# Patient Record
Sex: Male | Born: 1975
Health system: Southern US, Community
[De-identification: ages and names within clinical notes are randomized; demographics above are authoritative.]

## PROBLEM LIST (undated history)

## (undated) DIAGNOSIS — K219 Gastro-esophageal reflux disease without esophagitis: Secondary | ICD-10-CM

## (undated) DIAGNOSIS — I10 Essential (primary) hypertension: Secondary | ICD-10-CM

## (undated) HISTORY — PX: CHOLECYSTECTOMY: SHX55

---

## 2004-03-19 ENCOUNTER — Emergency Department (HOSPITAL_COMMUNITY): Admission: EM | Admit: 2004-03-19 | Discharge: 2004-03-19 | Payer: Self-pay | Admitting: *Deleted

## 2005-01-24 ENCOUNTER — Emergency Department (HOSPITAL_COMMUNITY): Admission: EM | Admit: 2005-01-24 | Discharge: 2005-01-24 | Payer: Self-pay | Admitting: Emergency Medicine

## 2005-01-28 ENCOUNTER — Ambulatory Visit (HOSPITAL_COMMUNITY): Admission: RE | Admit: 2005-01-28 | Discharge: 2005-01-28 | Payer: Self-pay | Admitting: Family Medicine

## 2005-03-07 ENCOUNTER — Encounter (HOSPITAL_COMMUNITY): Admission: RE | Admit: 2005-03-07 | Discharge: 2005-03-07 | Payer: Self-pay | Admitting: Family Medicine

## 2005-03-11 ENCOUNTER — Observation Stay (HOSPITAL_COMMUNITY): Admission: RE | Admit: 2005-03-11 | Discharge: 2005-03-12 | Payer: Self-pay | Admitting: General Surgery

## 2005-03-30 ENCOUNTER — Emergency Department (HOSPITAL_COMMUNITY): Admission: EM | Admit: 2005-03-30 | Discharge: 2005-03-30 | Payer: Self-pay | Admitting: Emergency Medicine

## 2005-04-20 ENCOUNTER — Ambulatory Visit: Payer: Self-pay | Admitting: Internal Medicine

## 2005-05-12 ENCOUNTER — Ambulatory Visit (HOSPITAL_COMMUNITY): Admission: RE | Admit: 2005-05-12 | Discharge: 2005-05-12 | Payer: Self-pay | Admitting: Internal Medicine

## 2005-05-12 ENCOUNTER — Ambulatory Visit: Payer: Self-pay | Admitting: Internal Medicine

## 2005-07-18 ENCOUNTER — Ambulatory Visit: Payer: Self-pay | Admitting: Internal Medicine

## 2005-08-18 ENCOUNTER — Emergency Department (HOSPITAL_COMMUNITY): Admission: EM | Admit: 2005-08-18 | Discharge: 2005-08-18 | Payer: Self-pay | Admitting: Emergency Medicine

## 2005-08-18 ENCOUNTER — Encounter (HOSPITAL_COMMUNITY): Admission: RE | Admit: 2005-08-18 | Discharge: 2005-09-17 | Payer: Self-pay | Admitting: Internal Medicine

## 2005-10-06 ENCOUNTER — Ambulatory Visit: Payer: Self-pay | Admitting: Internal Medicine

## 2006-12-24 IMAGING — CR DG CHEST 2V
2 series · 2 of 2 positions shown · non-contrast
Comparison: Portable film earlier today.

CLINICAL DATA: Chest pain.  Question left lower lobe density on portable chest x-ray.
 CHEST - 2 VIEW:

[view not recorded (1 of 2)]
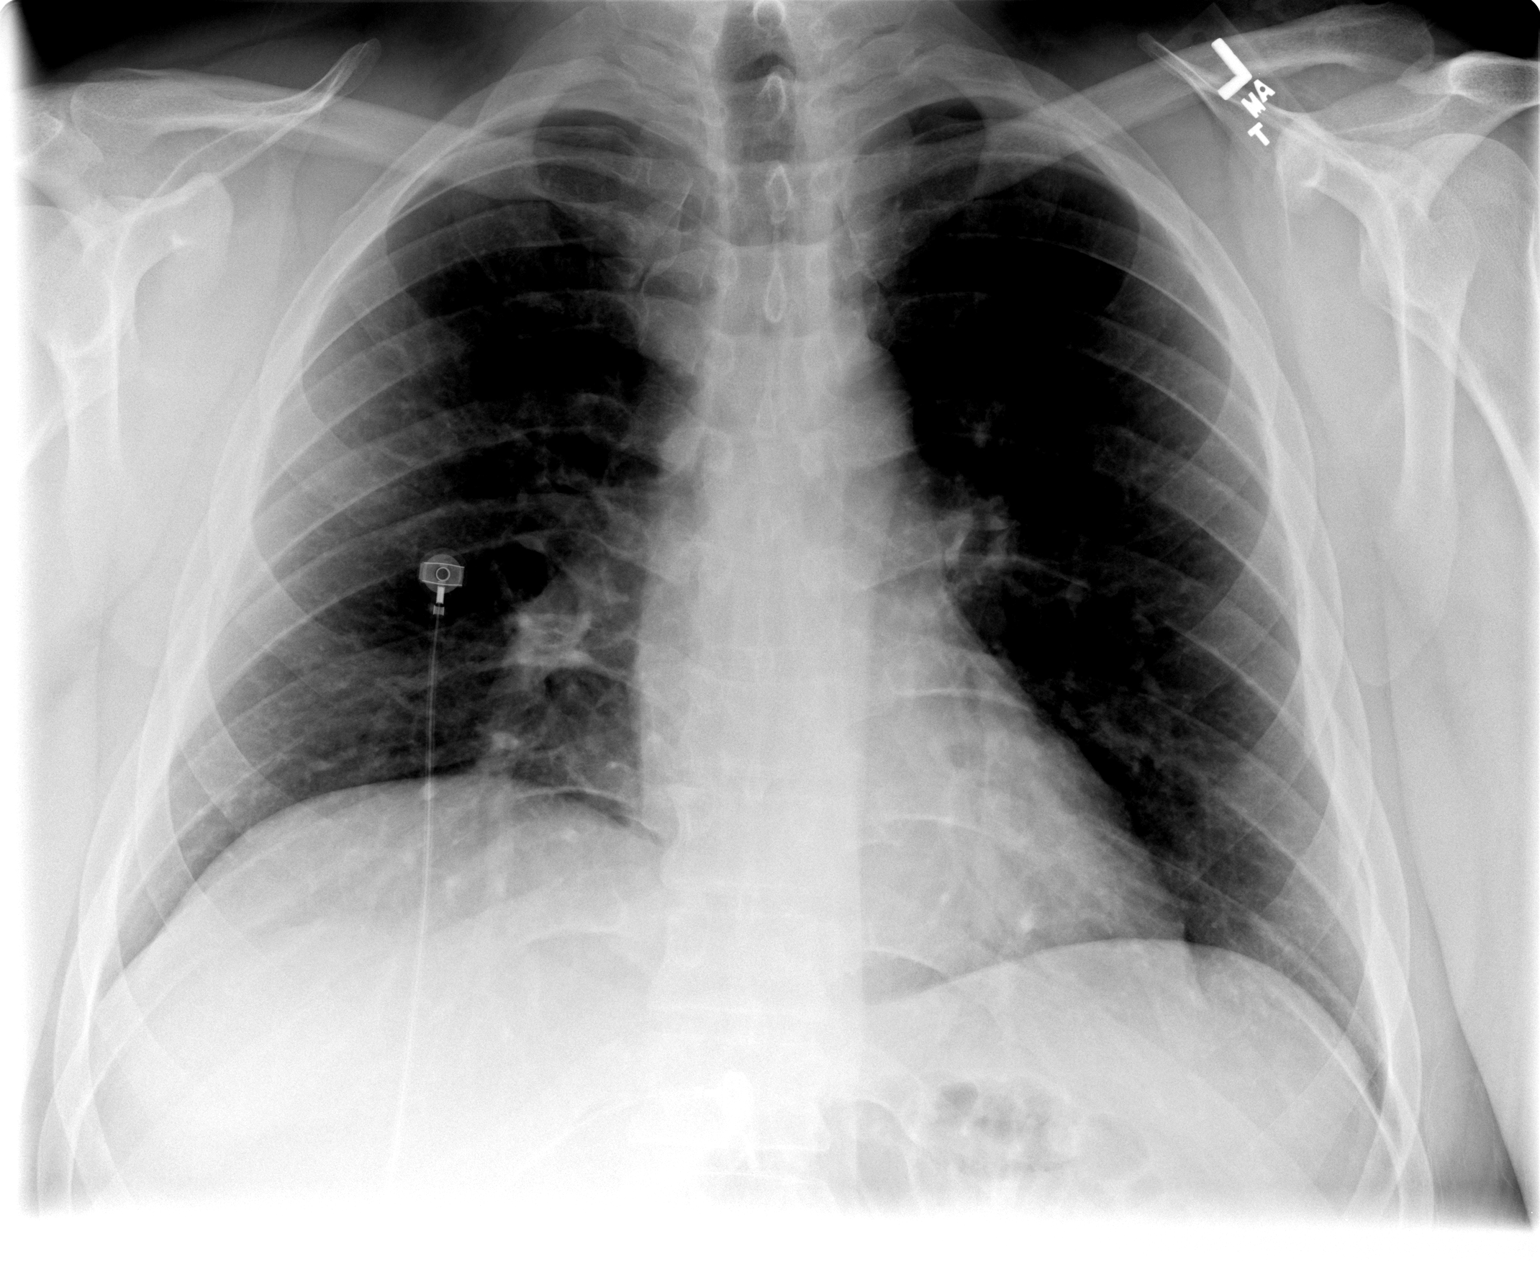

[view not recorded (2 of 2)]
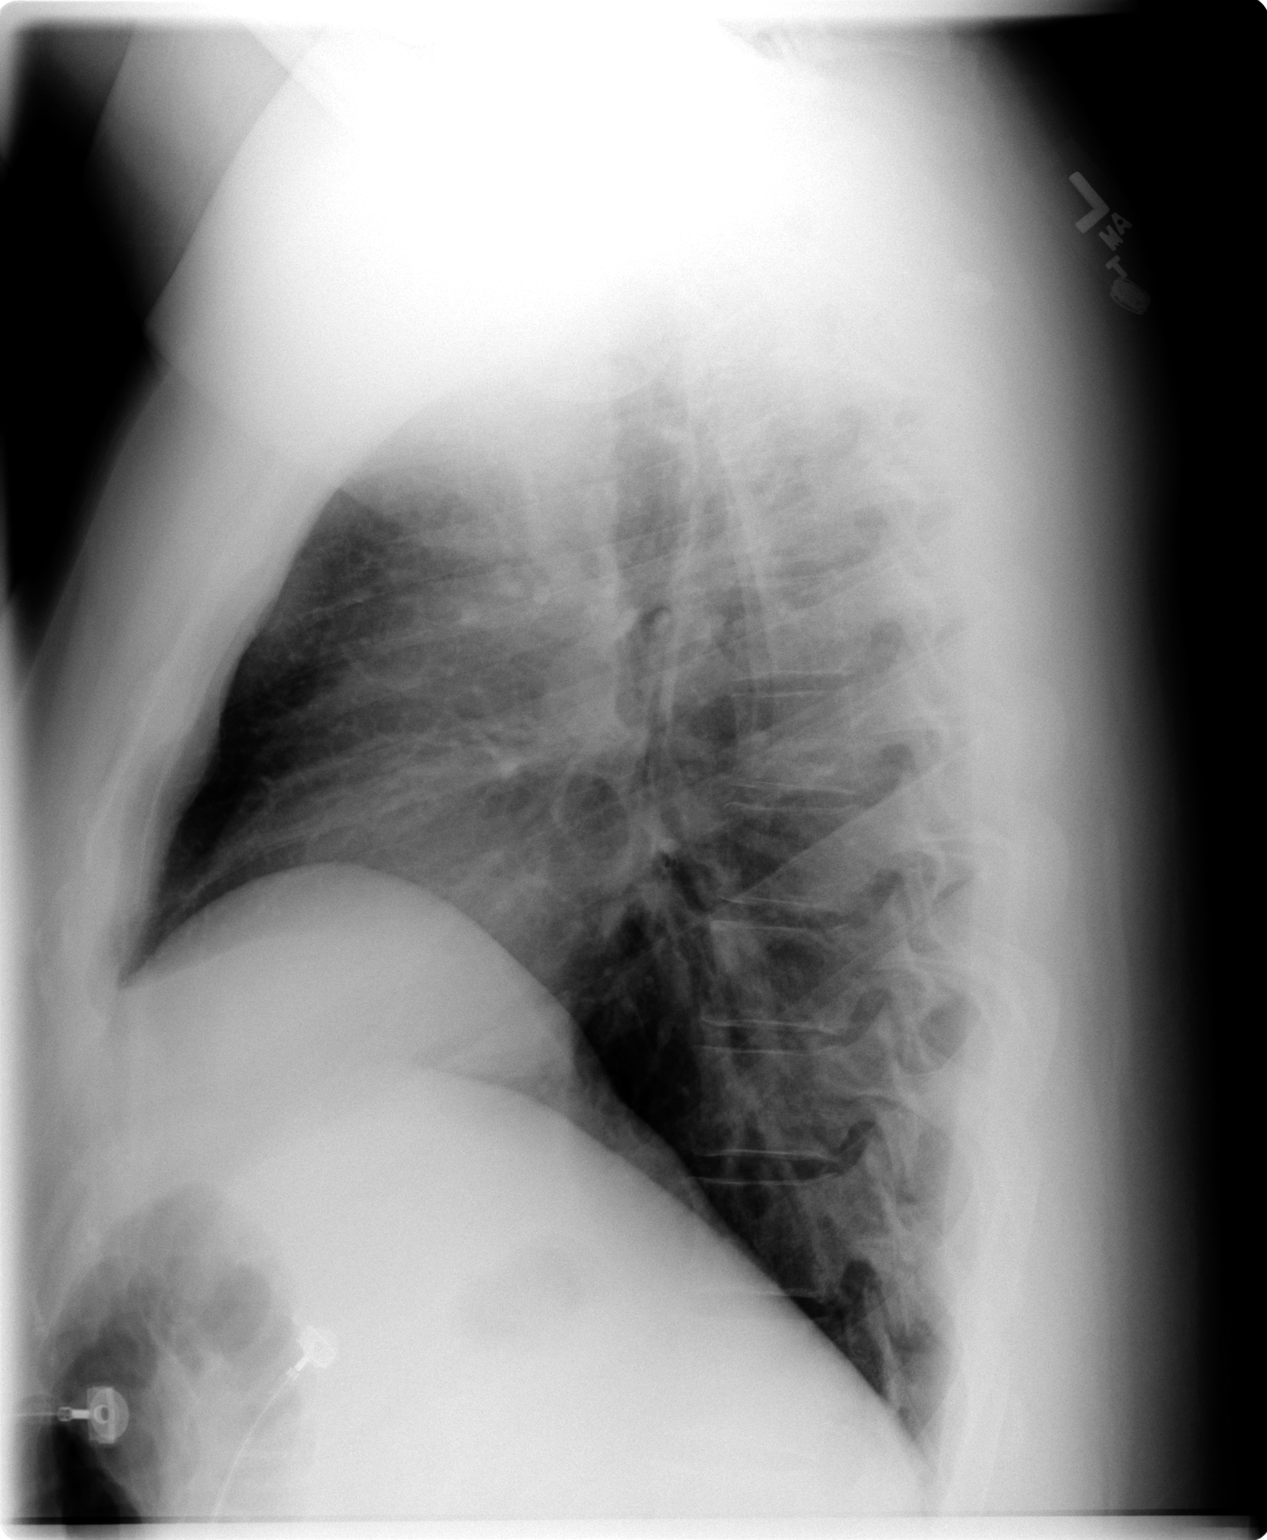

[2 of 2 positions shown; findings below may reference images not displayed]

Heart size is normal.  There is no heart failure, infiltrate or effusion.  There is mild left lower lobe atelectasis which I believe accounts for the density on the chest x-ray.
IMPRESSION: Mild left lower lobe atelectasis.  No acute abnormality.

## 2010-05-20 ENCOUNTER — Emergency Department (HOSPITAL_COMMUNITY)
Admission: EM | Admit: 2010-05-20 | Discharge: 2010-05-20 | Disposition: A | Discharge status: Home / Self Care | Payer: BC Managed Care – PPO | Attending: Emergency Medicine | Admitting: Emergency Medicine

## 2010-05-20 ENCOUNTER — Emergency Department (HOSPITAL_COMMUNITY): Payer: BC Managed Care – PPO

## 2010-05-20 DIAGNOSIS — I10 Essential (primary) hypertension: Secondary | ICD-10-CM | POA: Insufficient documentation

## 2010-05-20 DIAGNOSIS — R42 Dizziness and giddiness: Secondary | ICD-10-CM | POA: Insufficient documentation

## 2010-05-20 DIAGNOSIS — R51 Headache: Secondary | ICD-10-CM | POA: Insufficient documentation

## 2010-05-20 LAB — DIFFERENTIAL
Basophils Absolute: 0 10*3/uL (ref 0.0–0.1)
Eosinophils Absolute: 0.2 10*3/uL (ref 0.0–0.7)
Eosinophils Relative: 2 % (ref 0–5)
Lymphs Abs: 2.3 10*3/uL (ref 0.7–4.0)
Monocytes Absolute: 0.5 10*3/uL (ref 0.1–1.0)
Monocytes Relative: 6 % (ref 3–12)
Neutrophils Relative %: 63 % (ref 43–77)

## 2010-05-20 LAB — CBC
Hemoglobin: 14.8 g/dL (ref 13.0–17.0)
MCH: 27.8 pg (ref 26.0–34.0)
MCHC: 34.3 g/dL (ref 30.0–36.0)
MCV: 81.2 fL (ref 78.0–100.0)
Platelets: 208 10*3/uL (ref 150–400)
RBC: 5.32 MIL/uL (ref 4.22–5.81)
RDW: 13.3 % (ref 11.5–15.5)
WBC: 8.1 10*3/uL (ref 4.0–10.5)

## 2010-05-20 LAB — BASIC METABOLIC PANEL
CO2: 25 mEq/L (ref 19–32)
Chloride: 107 mEq/L (ref 96–112)
Creatinine, Ser: 0.98 mg/dL (ref 0.4–1.5)
GFR calc Af Amer: >60 mL/min (ref >60)
GFR calc non Af Amer: >60 mL/min (ref >60)
Glucose, Bld: 114 mg/dL — ABNORMAL HIGH (ref 70–99)
Potassium: 3.8 mEq/L (ref 3.5–5.1)
Sodium: 137 mEq/L (ref 135–145)

## 2012-12-04 ENCOUNTER — Ambulatory Visit (INDEPENDENT_AMBULATORY_CARE_PROVIDER_SITE_OTHER): Payer: Managed Care, Other (non HMO) | Admitting: Family Medicine

## 2012-12-04 ENCOUNTER — Encounter: Payer: Self-pay | Admitting: Family Medicine

## 2012-12-04 VITALS — BP 144/94 | Temp 98.7°F | Ht 75.0 in | Wt 337.0 lb

## 2012-12-04 DIAGNOSIS — L0291 Cutaneous abscess, unspecified: Secondary | ICD-10-CM

## 2012-12-04 DIAGNOSIS — L0390 Cellulitis, unspecified: Secondary | ICD-10-CM

## 2012-12-04 MED ORDER — DOXYCYCLINE HYCLATE 100 MG PO TBEC: 100.0000 mg | DELAYED_RELEASE_TABLET | Freq: Two times a day (BID) | ORAL | Status: DC

## 2012-12-04 NOTE — Progress Notes (Signed)
  Subjective:    Patient ID: Vincent Hahn, male    DOB: 15-Mar-1976, 37 y.o.   MRN: [id number]  HPIBoil on left leg. Seen at urgent care yesterday prescribed doxy  Notes very severe pain. Others in the family with similar illness. No fever no chills. Started doxycycline yesterday. Slight drainage at times. Good appetite no vomiting no diarrhea no lightheadedness ROS otherwise negative.  Review of Systems See above    Objective:   Physical Exam  Alert hydration good. HEENT normal. Lungs clear. Heart regular in rhythm. Left anterior leg erythematous tender warm to touch.      Assessment & Plan:  Impression probable MRSA cellulitis discussed at length. Plan Doxy twice a day 14 days. Local measures discussed. WSL

## 2012-12-06 ENCOUNTER — Ambulatory Visit (INDEPENDENT_AMBULATORY_CARE_PROVIDER_SITE_OTHER): Payer: Managed Care, Other (non HMO) | Admitting: Family Medicine

## 2012-12-06 ENCOUNTER — Encounter: Payer: Self-pay | Admitting: Family Medicine

## 2012-12-06 VITALS — BP 148/90 | Temp 98.3°F | Ht 75.0 in | Wt 318.0 lb

## 2012-12-06 DIAGNOSIS — L0390 Cellulitis, unspecified: Secondary | ICD-10-CM

## 2012-12-06 DIAGNOSIS — L0291 Cutaneous abscess, unspecified: Secondary | ICD-10-CM

## 2012-12-06 MED ORDER — SULFAMETHOXAZOLE-TMP DS 800-160 MG PO TABS: 1.0000 | ORAL_TABLET | Freq: Two times a day (BID) | ORAL | Status: DC

## 2012-12-06 NOTE — Progress Notes (Signed)
  Subjective:    Patient ID: Vincent Hahn, male    DOB: July 29, 1975, 37 y.o.   MRN: [id number]  HPIHere to recheck left leg. Left leg is still swelling. He is taking doxycycline. Patient feels there is more swollen at this time. Notes pain. No obvious fever or chills. Feels there is more swelling than a couple days ago. Long-standing history of some swollen particularly on feet all day long.   Review of Systems ROS otherwise negative    Objective:   Physical Exam  Alert lungs clear. Heart regular in rhythm. Both feet arterial pulses good sensation good. Evidence of venous stasis bilateral left anterior leg erythematous tender significant cellulitis noted      Assessment & Plan:  Impression probable MRSA cellulitis complicated by venous stasis. Plan add Bactrim DS to current doxycycline. Warning signs discussed expect slow resolution local measures discussed. WSL

## 2012-12-17 ENCOUNTER — Telehealth: Payer: Self-pay | Admitting: Family Medicine

## 2012-12-17 MED ORDER — SULFAMETHOXAZOLE-TMP DS 800-160 MG PO TABS: 1.0000 | ORAL_TABLET | Freq: Two times a day (BID) | ORAL | Status: DC

## 2012-12-17 NOTE — Telephone Encounter (Signed)
Rx sent electronically to [facility]. Patient notified. 

## 2012-12-17 NOTE — Telephone Encounter (Signed)
Bactrim ds bid tend 

## 2012-12-17 NOTE — Telephone Encounter (Signed)
Patient says his staph infection has not cleared up and would like to know if he needs to be seen or if we can call in another antibiotic?    [facility]

## 2013-02-04 ENCOUNTER — Encounter: Payer: Self-pay | Admitting: Family Medicine

## 2013-02-04 ENCOUNTER — Ambulatory Visit (INDEPENDENT_AMBULATORY_CARE_PROVIDER_SITE_OTHER): Payer: Managed Care, Other (non HMO) | Admitting: Family Medicine

## 2013-02-04 VITALS — BP 152/98 | Temp 98.2°F | Ht 75.0 in | Wt 338.0 lb

## 2013-02-04 DIAGNOSIS — J209 Acute bronchitis, unspecified: Secondary | ICD-10-CM

## 2013-02-04 MED ORDER — PREDNISONE 20 MG PO TABS: ORAL_TABLET | ORAL | Status: AC

## 2013-02-04 MED ORDER — ALBUTEROL SULFATE HFA 108 (90 BASE) MCG/ACT IN AERS: 2.0000 | INHALATION_SPRAY | Freq: Four times a day (QID) | RESPIRATORY_TRACT | Status: DC | PRN

## 2013-02-04 MED ORDER — AZITHROMYCIN 250 MG PO TABS: ORAL_TABLET | ORAL | Status: DC

## 2013-02-04 NOTE — Progress Notes (Signed)
  Subjective:    Patient ID: Vincent Hahn, male    DOB: 21-Apr-1975, 37 y.o.   MRN: [id number]  Cough This is a new problem. The current episode started in the past 7 days. The problem has been gradually worsening. The problem occurs every few minutes. The cough is non-productive. Associated symptoms include a fever, headaches, nasal congestion, postnasal drip, rhinorrhea, a sore throat, shortness of breath and wheezing. Associated symptoms comments: Diarrhea. The symptoms are aggravated by lying down. He has tried OTC cough suppressant and rest for the symptoms. The treatment provided mild relief.    Plus sig cough and diminished energy  Review of Systems  Constitutional: Positive for fever.  HENT: Positive for postnasal drip, rhinorrhea and sore throat.   Respiratory: Positive for cough, shortness of breath and wheezing.   Neurological: Positive for headaches.       Objective:   Physical Exam  Blood pressure improved on repeat 140/90. Alert mild malaise. H&T moderate his congestion. Lungs bilateral wheezes some rhonchi no crackles no tachypnea heart regular in rhythm      Assessment & Plan:  Impression acute bronchitis with reactive airways plan Z-Pak prednisone taper Ventolin 2 sprays every 4-6 when necessary for cough. Symptomatic care discussed. WSL

## 2013-02-08 ENCOUNTER — Telehealth: Payer: Self-pay | Admitting: Family Medicine

## 2013-02-08 NOTE — Telephone Encounter (Signed)
Patient says that he finished his steroid and antibiotic today. Getting better but, coughing a lot and a lot of congestion. No fever. Greenish mucous. Please advise. [facility] in [place]

## 2013-02-08 NOTE — Telephone Encounter (Signed)
Patient notified

## 2013-02-08 NOTE — Telephone Encounter (Signed)
When u take zith, you take it five d but it last at therapeutic levels for ten, give it some more time

## 2013-02-11 ENCOUNTER — Other Ambulatory Visit: Payer: Self-pay | Admitting: *Deleted

## 2013-02-11 ENCOUNTER — Telehealth: Payer: Self-pay | Admitting: Family Medicine

## 2013-02-11 MED ORDER — BENZONATATE 100 MG PO CAPS: 100.0000 mg | ORAL_CAPSULE | Freq: Four times a day (QID) | ORAL | Status: DC | PRN

## 2013-02-11 NOTE — Telephone Encounter (Signed)
Sent in medication and notified patient 

## 2013-02-11 NOTE — Telephone Encounter (Signed)
Pt is still coughing, has coughed so hard and much that his side hurts (even to the touch). Wants to know if there is anything he can do or get to help with this? [facility]

## 2013-02-11 NOTE — Telephone Encounter (Signed)
Tess perles 100 one q 6 prn twenty eight

## 2013-02-13 ENCOUNTER — Encounter: Payer: Self-pay | Admitting: Family Medicine

## 2013-02-13 ENCOUNTER — Ambulatory Visit (INDEPENDENT_AMBULATORY_CARE_PROVIDER_SITE_OTHER): Payer: Managed Care, Other (non HMO) | Admitting: Family Medicine

## 2013-02-13 VITALS — BP 124/86 | Temp 98.5°F | Ht 75.0 in | Wt 330.4 lb

## 2013-02-13 DIAGNOSIS — J45901 Unspecified asthma with (acute) exacerbation: Secondary | ICD-10-CM

## 2013-02-13 MED ORDER — LEVOFLOXACIN 500 MG PO TABS: 500.0000 mg | ORAL_TABLET | Freq: Every day | ORAL | Status: AC

## 2013-02-13 MED ORDER — PREDNISONE 20 MG PO TABS: 20.0000 mg | ORAL_TABLET | Freq: Every day | ORAL | Status: DC

## 2013-02-13 MED ORDER — PREDNISONE 20 MG PO TABS: ORAL_TABLET | ORAL | Status: AC

## 2013-02-13 NOTE — Progress Notes (Signed)
  Subjective:    Patient ID: Vincent Hahn, male    DOB: Feb 21, 1976, 37 y.o.   MRN: [id number]  Cough This is a new problem. The current episode started 1 to 4 weeks ago. The problem has been gradually worsening. The problem occurs constantly. The cough is non-productive. Nothing aggravates the symptoms. He has tried prescription cough suppressant and steroid inhaler for the symptoms. The treatment provided mild relief.   Patient still experiencing considerable wheeziness. On further history mother had history of asthma. Patient has wheezing at times with exertion cold air in sickness multiple times in the past.  Cough productive at times. Prednisone did seem to help been given earlier. Using his inhaler every 6 hours.   Review of Systems  Respiratory: Positive for cough.    no vomiting no diarrhea no headache no muscle pain ROS otherwise negative     Objective:   Physical Exam Alert hydration good. Vital stable. Lungs bilateral wheezes rhonchi significant heart regular in rhythm.       Assessment & Plan:  Impression persistent bronchitis with probable exacerbation of true asthma. Advised patient we should go ahead and did clear this as asthma plan repeat prednisone taper. Using inhaler every 4 hours. May use 3 positive feeling particularly tight. Levaquin daily 10 days.

## 2013-02-16 ENCOUNTER — Encounter (HOSPITAL_COMMUNITY): Payer: Self-pay | Admitting: Emergency Medicine

## 2013-02-16 ENCOUNTER — Emergency Department (HOSPITAL_COMMUNITY)
Admission: EM | Admit: 2013-02-16 | Discharge: 2013-02-16 | Disposition: A | Discharge status: Home / Self Care | Payer: Managed Care, Other (non HMO) | Attending: Emergency Medicine | Admitting: Emergency Medicine

## 2013-02-16 DIAGNOSIS — Y9389 Activity, other specified: Secondary | ICD-10-CM | POA: Insufficient documentation

## 2013-02-16 DIAGNOSIS — J40 Bronchitis, not specified as acute or chronic: Secondary | ICD-10-CM

## 2013-02-16 DIAGNOSIS — S335XXA Sprain of ligaments of lumbar spine, initial encounter: Secondary | ICD-10-CM | POA: Insufficient documentation

## 2013-02-16 DIAGNOSIS — J209 Acute bronchitis, unspecified: Secondary | ICD-10-CM | POA: Insufficient documentation

## 2013-02-16 DIAGNOSIS — X500XXA Overexertion from strenuous movement or load, initial encounter: Secondary | ICD-10-CM | POA: Insufficient documentation

## 2013-02-16 DIAGNOSIS — Y9289 Other specified places as the place of occurrence of the external cause: Secondary | ICD-10-CM | POA: Insufficient documentation

## 2013-02-16 DIAGNOSIS — Z792 Long term (current) use of antibiotics: Secondary | ICD-10-CM | POA: Insufficient documentation

## 2013-02-16 DIAGNOSIS — S39012A Strain of muscle, fascia and tendon of lower back, initial encounter: Secondary | ICD-10-CM

## 2013-02-16 DIAGNOSIS — Z79899 Other long term (current) drug therapy: Secondary | ICD-10-CM | POA: Insufficient documentation

## 2013-02-16 MED ORDER — HYDROCODONE-ACETAMINOPHEN 5-325 MG PO TABS
2.0000 | ORAL_TABLET | Freq: Once | ORAL | Status: AC
  Administered 2013-02-16: 2 via ORAL
  Filled 2013-02-16: qty 2

## 2013-02-16 MED ORDER — HYDROCODONE-ACETAMINOPHEN 5-325 MG PO TABS: 1.0000 | ORAL_TABLET | ORAL | Status: DC | PRN

## 2013-02-16 MED ORDER — METHOCARBAMOL 500 MG PO TABS: 500.0000 mg | ORAL_TABLET | Freq: Three times a day (TID) | ORAL | Status: DC

## 2013-02-16 MED ORDER — ONDANSETRON HCL 4 MG PO TABS
4.0000 mg | ORAL_TABLET | Freq: Once | ORAL | Status: AC
  Administered 2013-02-16: 4 mg via ORAL
  Filled 2013-02-16: qty 1

## 2013-02-16 MED ORDER — METHOCARBAMOL 500 MG PO TABS
1000.0000 mg | ORAL_TABLET | Freq: Once | ORAL | Status: AC
  Administered 2013-02-16: 1000 mg via ORAL
  Filled 2013-02-16: qty 2

## 2013-02-16 NOTE — ED Provider Notes (Signed)
CSN: [id number]     Arrival date & time 02/16/13  2007 History   First MD Initiated Contact with Patient 02/16/13 2108     Chief Complaint  Patient presents with  . Back Pain   (Consider location/radiation/quality/duration/timing/severity/associated sxs/prior Treatment) HPI Comments: Patient states he has been dealing with upper respiratory infection and bronchitis recently. He has been treated with Levaquin and prednisone. He states he continues to have problems with cough from time to time. Tonight he was driving had a coughing spell and felt a sharp pop in his lower back area. He states that following this he continued to have pain. There was no loss of bowel or bladder function during this episode. After the episode the patient has been able to walk without problem but has pain of the back with certain movement. He has not had any unusual shortness of breath or difficulty breathing. Patient denies any recent injury to the back, but states he has been coughing and having pain in his back most of the week.  The history is provided by the patient.    History reviewed. No pertinent past medical history. Past Surgical History  Procedure Laterality Date  . Cholecystectomy     History reviewed. No pertinent family history. History  Substance Use Topics  . Smoking status: Never Smoker   . Smokeless tobacco: Not on file  . Alcohol Use: No    Review of Systems  Constitutional: Negative for activity change.       All ROS Neg except as noted in HPI  HENT: Negative for nosebleeds.   Eyes: Negative for photophobia and discharge.  Respiratory: Positive for cough and wheezing. Negative for shortness of breath.   Cardiovascular: Negative for chest pain and palpitations.  Gastrointestinal: Negative for abdominal pain and blood in stool.  Genitourinary: Negative for dysuria, frequency and hematuria.  Musculoskeletal: Positive for back pain. Negative for arthralgias and neck pain.  Skin:  Negative.   Neurological: Negative for dizziness, seizures and speech difficulty.  Psychiatric/Behavioral: Negative for hallucinations and confusion.    Allergies  Review of patient's allergies indicates no known allergies.  Home Medications   Current Outpatient Rx  Name  Route  Sig  Dispense  Refill  . acetaminophen (TYLENOL) 500 MG tablet   Oral   Take 500 mg by mouth every 6 (six) hours as needed.         Marland Kitchen albuterol (PROVENTIL HFA;VENTOLIN HFA) 108 (90 BASE) MCG/ACT inhaler   Inhalation   Inhale 2 puffs into the lungs every 6 (six) hours as needed for wheezing.   1 Inhaler   2   . benzonatate (TESSALON) 100 MG capsule   Oral   Take 1 capsule (100 mg total) by mouth 4 (four) times daily as needed for cough.   28 capsule   0   . ibuprofen (ADVIL,MOTRIN) 200 MG tablet   Oral   Take 200 mg by mouth every 6 (six) hours as needed.         . lansoprazole (PREVACID) 30 MG capsule   Oral   Take 30 mg by mouth daily.         Marland Kitchen levofloxacin (LEVAQUIN) 500 MG tablet   Oral   Take 1 tablet (500 mg total) by mouth daily.   10 tablet   0   . predniSONE (DELTASONE) 20 MG tablet      Three qd for three d two qd for three d two qd for two d   17 tablet  0    BP 148/94  Pulse 96  Temp(Src) 98.1 F (36.7 C)  Resp 20  Ht 6\' 3"  (1.905 m)  Wt 330 lb (149.687 kg)  BMI 41.25 kg/m2  SpO2 96% Physical Exam  Nursing note and vitals reviewed. Constitutional: He is oriented to person, place, and time. He appears well-developed and well-nourished.  Non-toxic appearance.  HENT:  Head: Normocephalic.  Right Ear: Tympanic membrane and external ear normal.  Left Ear: Tympanic membrane and external ear normal.  Eyes: EOM and lids are normal. Pupils are equal, round, and reactive to light.  Neck: Normal range of motion. Neck supple. Carotid bruit is not present.  Cardiovascular: Normal rate, regular rhythm, normal heart sounds, intact distal pulses and normal pulses.    Pulmonary/Chest: Breath sounds normal. No respiratory distress.  Coarse breath sounds present. No wheeze this time.  Abdominal: Soft. Bowel sounds are normal. There is no tenderness. There is no guarding.  Musculoskeletal: Normal range of motion.  There is pain and spasm with attempted range of motion of the paraspinal area in the lumbar region. There is no palpable step off of the cervical, thoracic, or lumbar region. There no hot areas appreciated.  Lymphadenopathy:       Head (right side): No submandibular adenopathy present.       Head (left side): No submandibular adenopathy present.    He has no cervical adenopathy.  Neurological: He is alert and oriented to person, place, and time. He has normal strength. No cranial nerve deficit or sensory deficit.  No gross neurologic deficits appreciated of the lower extremities.  Skin: Skin is warm and dry.  Psychiatric: He has a normal mood and affect. His speech is normal.    ED Course  Procedures (including critical care time) Labs Review Labs Reviewed - No data to display Imaging Review No results found.  EKG Interpretation   None       MDM  No diagnosis found. *I have reviewed nursing notes, vital signs, and all appropriate lab and imaging results for this patient.**  No gross neurologic deficits appreciated on examination. Suspect patient has a muscle strain from cough.  Vital signs are stable. Pulse oximetry is 96% on room air. Within normal limits by my interpretation.  Plan at this time is for the patient to continue his prednisone. Will add Norco and Robaxin. Advised patient that these may cause drowsiness, and to use with caution. Patient also to use heat to the back.  Kathie Dike, [name] 02/16/13 2213

## 2013-02-16 NOTE — ED Notes (Signed)
Pt states he has had a bad cough for about 3wks. Pt states tonight he was coughing while driving and heard a pop in his mid to lower back.

## 2013-02-17 NOTE — ED Provider Notes (Signed)
Medical screening examination/treatment/procedure(s) were performed by non-physician practitioner and as supervising physician I was immediately available for consultation/collaboration.  EKG Interpretation   None         Marcayla Budge M Fran Mcree, DO 02/17/13 0012 

## 2015-09-07 ENCOUNTER — Ambulatory Visit (INDEPENDENT_AMBULATORY_CARE_PROVIDER_SITE_OTHER): Payer: 59 | Admitting: Family Medicine

## 2015-09-07 ENCOUNTER — Encounter: Payer: Self-pay | Admitting: Family Medicine

## 2015-09-07 VITALS — BP 142/94 | Temp 98.3°F | Ht 75.0 in | Wt 390.0 lb

## 2015-09-07 DIAGNOSIS — J4521 Mild intermittent asthma with (acute) exacerbation: Secondary | ICD-10-CM

## 2015-09-07 DIAGNOSIS — Z139 Encounter for screening, unspecified: Secondary | ICD-10-CM

## 2015-09-07 DIAGNOSIS — I10 Essential (primary) hypertension: Secondary | ICD-10-CM | POA: Diagnosis not present

## 2015-09-07 DIAGNOSIS — R21 Rash and other nonspecific skin eruption: Secondary | ICD-10-CM | POA: Diagnosis not present

## 2015-09-07 MED ORDER — DOXYCYCLINE HYCLATE 100 MG PO TABS: 100.0000 mg | ORAL_TABLET | Freq: Two times a day (BID) | ORAL | Status: DC

## 2015-09-07 NOTE — Patient Instructions (Signed)
Hypertension Hypertension, commonly called high blood pressure, is when the force of blood pumping through your arteries is too strong. Your arteries are the blood vessels that carry blood from your heart throughout your body. A blood pressure reading consists of a higher number over a lower number, such as 110/72. The higher number (systolic) is the pressure inside your arteries when your heart pumps. The lower number (diastolic) is the pressure inside your arteries when your heart relaxes. Ideally you want your blood pressure below 120/80. Hypertension forces your heart to work harder to pump blood. Your arteries may become narrow or stiff. Having untreated or uncontrolled hypertension can cause heart attack, stroke, kidney disease, and other problems. RISK FACTORS Some risk factors for high blood pressure are controllable. Others are not.  Risk factors you cannot control include:   Race. You may be at higher risk if you are African American.  Age. Risk increases with age.  Gender. Men are at higher risk than women before age 45 years. After age 65, women are at higher risk than men. Risk factors you can control include:  Not getting enough exercise or physical activity.  Being overweight.  Getting too much fat, sugar, calories, or salt in your diet.  Drinking too much alcohol. SIGNS AND SYMPTOMS Hypertension does not usually cause signs or symptoms. Extremely high blood pressure (hypertensive crisis) may cause headache, anxiety, shortness of breath, and nosebleed. DIAGNOSIS To check if you have hypertension, your health care provider will measure your blood pressure while you are seated, with your arm held at the level of your heart. It should be measured at least twice using the same arm. Certain conditions can cause a difference in blood pressure between your right and left arms. A blood pressure reading that is higher than normal on one occasion does not mean that you need treatment. If  it is not clear whether you have high blood pressure, you may be asked to return on a different day to have your blood pressure checked again. Or, you may be asked to monitor your blood pressure at home for 1 or more weeks. TREATMENT Treating high blood pressure includes making lifestyle changes and possibly taking medicine. Living a healthy lifestyle can help lower high blood pressure. You may need to change some of your habits. Lifestyle changes may include:  Following the DASH diet. This diet is high in fruits, vegetables, and whole grains. It is low in salt, red meat, and added sugars.  Keep your sodium intake below 2,300 mg per day.  Getting at least 30-45 minutes of aerobic exercise at least 4 times per week.  Losing weight if necessary.  Not smoking.  Limiting alcoholic beverages.  Learning ways to reduce stress. Your health care provider may prescribe medicine if lifestyle changes are not enough to get your blood pressure under control, and if one of the following is true:  You are 18-59 years of age and your systolic blood pressure is above 140.  You are 60 years of age or older, and your systolic blood pressure is above 150.  Your diastolic blood pressure is above 90.  You have diabetes, and your systolic blood pressure is over 140 or your diastolic blood pressure is over 90.  You have kidney disease and your blood pressure is above 140/90.  You have heart disease and your blood pressure is above 140/90. Your personal target blood pressure may vary depending on your medical conditions, your age, and other factors. HOME CARE INSTRUCTIONS    Have your blood pressure rechecked as directed by your health care provider.   Take medicines only as directed by your health care provider. Follow the directions carefully. Blood pressure medicines must be taken as prescribed. The medicine does not work as well when you skip doses. Skipping doses also puts you at risk for  problems.  Do not smoke.   Monitor your blood pressure at home as directed by your health care provider. SEEK MEDICAL CARE IF:   You think you are having a reaction to medicines taken.  You have recurrent headaches or feel dizzy.  You have swelling in your ankles.  You have trouble with your vision. SEEK IMMEDIATE MEDICAL CARE IF:  You develop a severe headache or confusion.  You have unusual weakness, numbness, or feel faint.  You have severe chest or abdominal pain.  You vomit repeatedly.  You have trouble breathing. MAKE SURE YOU:   Understand these instructions.  Will watch your condition.  Will get help right away if you are not doing well or get worse.   This information is not intended to replace advice given to you by your health care provider. Make sure you discuss any questions you have with your health care provider.   Document Released: 03/21/2005 Document Revised: 08/05/2014 Document Reviewed: 01/11/2013 Elsevier Interactive Patient Education 2016 [employer]  

## 2015-09-07 NOTE — Progress Notes (Signed)
   Subjective:    Patient ID: Vincent Hahn, male    DOB: [birth year], 40 y.o.   MRN: [id number] Patient presents with several concerns Rash This is a new problem. Episode onset: 3 weeks ago. Location: back on left side. The rash is characterized by itchiness, pain and redness (feels hot to touch). Treatments tried: amoxil 250 mg one bid for 2 days that was left over    Pt states he has been checking bp at work and it has been elevated for awhile now. Vincent Hahn family history of hypertension. Father has recently developed heart disease.  Has always had a challenge with obesity. Now substantial worse. Continues to gain weight. Admits to not having a great diet or exercising.  bp numbers on the hi side,  Htn on both sides of the family  Pos fam hx of diabetes  Bad chigger bite tend and sore ane now enlarged  Felt sure not tick biete  occas use of inhaler once or twic e per mo  , long-standing history of asthma. Generally mild in nature. Only occasional use of inhaler.  Review of Systems  Skin: Positive for rash.   No headache no chest pain no abdominal pain    Objective:   Physical Exam  Alert vitals stable blood pressure remains elevated 144/92 on repeat. H&T decent lungs clear. Heart regular in rhythm. Right flank erythematous patch tender in nature with central bite no fluctuance morbid obesity present      Assessment & Plan:  Impression 1 hypertension discussed likely the real Deel. Strong family history. Educational information given. #2 bite with secondary infection discussed #3 morbid obesity discussed #4 asthma mild intermittent in nature plan appropriate blood work. Docs see twice a day 10 days. Complete physical in 6 weeks. Educational information hypertension given WSL

## 2015-10-08 LAB — LIPID PANEL
CHOLESTEROL TOTAL: 188 mg/dL (ref 100–199)
Chol/HDL Ratio: 5.4 ratio units — ABNORMAL HIGH (ref 0.0–5.0)
HDL: 35 mg/dL — ABNORMAL LOW (ref >39)
LDL Calculated: 105 mg/dL — ABNORMAL HIGH (ref 0–99)
Triglycerides: 240 mg/dL — ABNORMAL HIGH (ref 0–149)
VLDL Cholesterol Cal: 48 mg/dL — ABNORMAL HIGH (ref 5–40)

## 2015-10-08 LAB — HEPATIC FUNCTION PANEL
ALK PHOS: 110 IU/L (ref 39–117)
ALT: 27 IU/L (ref 0–44)
AST: 15 IU/L (ref 0–40)
Albumin: 3.8 g/dL (ref 3.5–5.5)
Bilirubin Total: 0.3 mg/dL (ref 0.0–1.2)
Bilirubin, Direct: 0.08 mg/dL (ref 0.00–0.40)
Total Protein: 6.7 g/dL (ref 6.0–8.5)

## 2015-10-08 LAB — BASIC METABOLIC PANEL
BUN/Creatinine Ratio: 13 (ref 9–20)
BUN: 11 mg/dL (ref 6–24)
CO2: 26 mmol/L (ref 18–29)
Calcium: 9.1 mg/dL (ref 8.7–10.2)
Chloride: 99 mmol/L (ref 96–106)
Creatinine, Ser: 0.85 mg/dL (ref 0.76–1.27)
GFR calc Af Amer: 126 mL/min/{1.73_m2} (ref >59)
GFR calc non Af Amer: 109 mL/min/{1.73_m2} (ref >59)
GLUCOSE: 98 mg/dL (ref 65–99)
Potassium: 4.9 mmol/L (ref 3.5–5.2)
Sodium: 140 mmol/L (ref 134–144)

## 2015-10-14 ENCOUNTER — Encounter: Payer: Self-pay | Admitting: Family Medicine

## 2015-10-14 ENCOUNTER — Ambulatory Visit (INDEPENDENT_AMBULATORY_CARE_PROVIDER_SITE_OTHER): Payer: 59 | Admitting: Family Medicine

## 2015-10-14 VITALS — BP 144/92 | Ht 74.75 in | Wt 382.0 lb

## 2015-10-14 DIAGNOSIS — I10 Essential (primary) hypertension: Secondary | ICD-10-CM

## 2015-10-14 DIAGNOSIS — Z0000 Encounter for general adult medical examination without abnormal findings: Secondary | ICD-10-CM | POA: Diagnosis not present

## 2015-10-14 MED ORDER — ENALAPRIL MALEATE 10 MG PO TABS: 10.0000 mg | ORAL_TABLET | Freq: Every day | ORAL | Status: DC

## 2015-10-14 NOTE — Patient Instructions (Signed)
Results for orders placed or performed in visit on 09/07/15  Lipid panel  Result Value Ref Range   Cholesterol, Total 188 100 - 199 mg/dL   Triglycerides 240 (H) 0 - 149 mg/dL   HDL 35 (L) >39 mg/dL   VLDL Cholesterol Cal 48 (H) 5 - 40 mg/dL   LDL Calculated 105 (H) 0 - 99 mg/dL   Chol/HDL Ratio 5.4 (H) 0.0 - 5.0 ratio units  Hepatic function panel  Result Value Ref Range   Total Protein 6.7 6.0 - 8.5 g/dL   Albumin 3.8 3.5 - 5.5 g/dL   Bilirubin Total 0.3 0.0 - 1.2 mg/dL   Bilirubin, Direct 0.08 0.00 - 0.40 mg/dL   Alkaline Phosphatase 110 39 - 117 IU/L   AST 15 0 - 40 IU/L   ALT 27 0 - 44 IU/L  Basic metabolic panel  Result Value Ref Range   Glucose 98 65 - 99 mg/dL   BUN 11 6 - 24 mg/dL   Creatinine, Ser 0.85 0.76 - 1.27 mg/dL   GFR calc non Af Amer 109 >59 mL/min/1.73   GFR calc Af Amer 126 >59 mL/min/1.73   BUN/Creatinine Ratio 13 9 - 20   Sodium 140 134 - 144 mmol/L   Potassium 4.9 3.5 - 5.2 mmol/L   Chloride 99 96 - 106 mmol/L   CO2 26 18 - 29 mmol/L   Calcium 9.1 8.7 - 10.2 mg/dL

## 2015-10-14 NOTE — Progress Notes (Signed)
Subjective:    Patient ID: Vincent Hahn, male    DOB: May 06, 1975, 40 y.o.   MRN: [id number]  HPI The patient comes in today for a wellness visit.  Both parents had high b p  A review of their health history was completed.  A review of medications was also completed.  Any needed refills; none  Eating habits: not health conscious  Falls/  MVA accidents in past few months: none  Regular exercise: 7,000 - 8,000 steps a day at work  Specialist pt sees on regular basis: none  Preventative health issues were discussed.   Results for orders placed or performed in visit on 09/07/15  Lipid panel  Result Value Ref Range   Cholesterol, Total 188 100 - 199 mg/dL   Triglycerides 240 (H) 0 - 149 mg/dL   HDL 35 (L) >39 mg/dL   VLDL Cholesterol Cal 48 (H) 5 - 40 mg/dL   LDL Calculated 105 (H) 0 - 99 mg/dL   Chol/HDL Ratio 5.4 (H) 0.0 - 5.0 ratio units  Hepatic function panel  Result Value Ref Range   Total Protein 6.7 6.0 - 8.5 g/dL   Albumin 3.8 3.5 - 5.5 g/dL   Bilirubin Total 0.3 0.0 - 1.2 mg/dL   Bilirubin, Direct 0.08 0.00 - 0.40 mg/dL   Alkaline Phosphatase 110 39 - 117 IU/L   AST 15 0 - 40 IU/L   ALT 27 0 - 44 IU/L  Basic metabolic panel  Result Value Ref Range   Glucose 98 65 - 99 mg/dL   BUN 11 6 - 24 mg/dL   Creatinine, Ser 0.85 0.76 - 1.27 mg/dL   GFR calc non Af Amer 109 >59 mL/min/1.73   GFR calc Af Amer 126 >59 mL/min/1.73   BUN/Creatinine Ratio 13 9 - 20   Sodium 140 134 - 144 mmol/L   Potassium 4.9 3.5 - 5.2 mmol/L   Chloride 99 96 - 106 mmol/L   CO2 26 18 - 29 mmol/L   Calcium 9.1 8.7 - 10.2 mg/dL    Out of work since the fourth of july Additional concerns: bp running high  bilat hx of diabetes and high b p and sig obesity  A lot of exercise at work with walking Review of Systems  Constitutional: Negative for fever, activity change and appetite change.  HENT: Negative for congestion and rhinorrhea.   Eyes: Negative for discharge.  Respiratory:  Negative for cough and wheezing.   Cardiovascular: Negative for chest pain.  Gastrointestinal: Negative for vomiting, abdominal pain and blood in stool.  Genitourinary: Negative for frequency and difficulty urinating.  Musculoskeletal: Negative for neck pain.  Skin: Negative for rash.  Allergic/Immunologic: Negative for environmental allergies and food allergies.  Neurological: Negative for weakness and headaches.  Psychiatric/Behavioral: Negative for agitation.  All other systems reviewed and are negative.      Objective:   Physical Exam  Constitutional: He appears well-developed and well-nourished.  Morbid obesity present  HENT:  Head: Normocephalic and atraumatic.  Right Ear: External ear normal.  Left Ear: External ear normal.  Nose: Nose normal.  Mouth/Throat: Oropharynx is clear and moist.  Eyes: EOM are normal. Pupils are equal, round, and reactive to light.  Neck: Normal range of motion. Neck supple. No thyromegaly present.  Cardiovascular: Normal rate, regular rhythm and normal heart sounds.   No murmur heard. Pulmonary/Chest: Effort normal and breath sounds normal. No respiratory distress. He has no wheezes.  Abdominal: Soft. Bowel sounds are normal. He exhibits  no distension and no mass. There is no tenderness.  Genitourinary: Penis normal.  Musculoskeletal: Normal range of motion. He exhibits no edema.  Lymphadenopathy:    He has no cervical adenopathy.  Neurological: He is alert. He exhibits normal muscle tone.  Skin: Skin is warm and dry. No erythema.  Psychiatric: He has a normal mood and affect. His behavior is normal. Judgment normal.  Vitals reviewed.         Assessment & Plan:  Impression 1 wellness exam #2 hypertension new diagnosis discussed time get on with medication. #3 morbid obesity. Serious diagnosis. Discussed at length. Plan diet exercise discussed. Blood work reviewed. Medication initiated. Enalapril daily at bedtime. Recheck in 3 months.  WSL

## 2016-01-15 ENCOUNTER — Ambulatory Visit (INDEPENDENT_AMBULATORY_CARE_PROVIDER_SITE_OTHER): Payer: 59 | Admitting: Family Medicine

## 2016-01-15 ENCOUNTER — Encounter: Payer: Self-pay | Admitting: Family Medicine

## 2016-01-15 VITALS — BP 146/92 | Ht 75.0 in | Wt 380.1 lb

## 2016-01-15 DIAGNOSIS — I10 Essential (primary) hypertension: Secondary | ICD-10-CM

## 2016-01-15 MED ORDER — ENALAPRIL MALEATE 10 MG PO TABS: 10.0000 mg | ORAL_TABLET | Freq: Every day | ORAL | 5 refills | Status: DC

## 2016-01-15 NOTE — Progress Notes (Signed)
   Subjective:    Patient ID: Vincent Hahn, male    DOB: 1975-08-06, 40 y.o.   MRN: [id number]  Hypertension  This is a chronic problem.   Patient states has missed two doses of enalapril .   Notes bp quite a bit higher of late  Also notes tae occas cough  Walking a lot woith exercise  18 k steps , [place]     States no other concerns this visit.   Review of Systems No headache, no major weight loss or weight gain, no chest pain no back pain abdominal pain no change in bowel habits complete ROS otherwise negative     Objective:   Physical Exam   Alert vitals stable, NAD. Blood pressure good on repeat. HEENT normal. Lungs clear. Heart regular rate and rhythm.      Assessment & Plan:  Impression hypertension good control other than last 2 days 1 ran out of medication #2 obesity discussed #3 reactive airways stable plan diet exercise discussed. Medications refilled. Patient to monitor numbers at work. Return in 9 months for wellness plus chronic visit

## 2016-05-23 ENCOUNTER — Other Ambulatory Visit: Payer: Self-pay | Admitting: Family Medicine

## 2016-08-25 ENCOUNTER — Other Ambulatory Visit: Payer: Self-pay | Admitting: *Deleted

## 2016-08-25 ENCOUNTER — Other Ambulatory Visit: Payer: Self-pay | Admitting: Family Medicine

## 2016-09-27 ENCOUNTER — Other Ambulatory Visit: Payer: Self-pay | Admitting: Family Medicine

## 2016-10-24 ENCOUNTER — Telehealth: Payer: Self-pay | Admitting: Family Medicine

## 2016-10-24 ENCOUNTER — Other Ambulatory Visit: Payer: Self-pay | Admitting: Family Medicine

## 2016-10-24 DIAGNOSIS — Z79899 Other long term (current) drug therapy: Secondary | ICD-10-CM

## 2016-10-24 DIAGNOSIS — I10 Essential (primary) hypertension: Secondary | ICD-10-CM

## 2016-10-24 DIAGNOSIS — Z13220 Encounter for screening for lipoid disorders: Secondary | ICD-10-CM

## 2016-10-24 NOTE — Telephone Encounter (Signed)
Patient has an appointment on 11/11/16 with Dr. Richardson Landry.  He wants to know if he is due for labwork?

## 2016-10-26 NOTE — Telephone Encounter (Signed)
Spoke with patient and informed him per Dr.Steve Emmett have been ordered. Patient verbalized understanding.

## 2016-10-26 NOTE — Telephone Encounter (Signed)
Lip liv m7

## 2016-11-07 DIAGNOSIS — Z79899 Other long term (current) drug therapy: Secondary | ICD-10-CM | POA: Diagnosis not present

## 2016-11-07 DIAGNOSIS — Z13220 Encounter for screening for lipoid disorders: Secondary | ICD-10-CM | POA: Diagnosis not present

## 2016-11-07 DIAGNOSIS — I10 Essential (primary) hypertension: Secondary | ICD-10-CM | POA: Diagnosis not present

## 2016-11-08 ENCOUNTER — Telehealth: Payer: Self-pay | Admitting: Family Medicine

## 2016-11-08 ENCOUNTER — Other Ambulatory Visit: Payer: Self-pay | Admitting: *Deleted

## 2016-11-08 LAB — BASIC METABOLIC PANEL
BUN/Creatinine Ratio: 14 (ref 9–20)
BUN: 11 mg/dL (ref 6–24)
CALCIUM: 9.1 mg/dL (ref 8.7–10.2)
CO2: 25 mmol/L (ref 20–29)
CREATININE: 0.8 mg/dL (ref 0.76–1.27)
Chloride: 105 mmol/L (ref 96–106)
GFR calc Af Amer: 128 mL/min/{1.73_m2} (ref >59)
GFR, EST NON AFRICAN AMERICAN: 111 mL/min/{1.73_m2} (ref >59)
GLUCOSE: 94 mg/dL (ref 65–99)
POTASSIUM: 4.8 mmol/L (ref 3.5–5.2)
Sodium: 142 mmol/L (ref 134–144)

## 2016-11-08 LAB — HEPATIC FUNCTION PANEL
ALBUMIN: 4.2 g/dL (ref 3.5–5.5)
ALT: 27 IU/L (ref 0–44)
AST: 18 IU/L (ref 0–40)
Alkaline Phosphatase: 107 IU/L (ref 39–117)
Bilirubin Total: 0.3 mg/dL (ref 0.0–1.2)
Bilirubin, Direct: 0.08 mg/dL (ref 0.00–0.40)
TOTAL PROTEIN: 6.8 g/dL (ref 6.0–8.5)

## 2016-11-08 LAB — LIPID PANEL
Chol/HDL Ratio: 4.6 ratio (ref 0.0–5.0)
Cholesterol, Total: 180 mg/dL (ref 100–199)
HDL: 39 mg/dL — ABNORMAL LOW (ref >39)
LDL CALC: 108 mg/dL — AB (ref 0–99)
Triglycerides: 165 mg/dL — ABNORMAL HIGH (ref 0–149)
VLDL Cholesterol Cal: 33 mg/dL (ref 5–40)

## 2016-11-08 MED ORDER — ENALAPRIL MALEATE 10 MG PO TABS: 10.0000 mg | ORAL_TABLET | Freq: Every day | ORAL | 0 refills | Status: DC

## 2016-11-08 NOTE — Telephone Encounter (Signed)
Last appt oct 2017

## 2016-11-08 NOTE — Telephone Encounter (Signed)
Pt is needing a refill on his  enalapril (VASOTEC) 10 MG tablet  Pt is out and has an appt scheduled with [place] on Fri. Please advise.    Festus Barren

## 2016-11-08 NOTE — Telephone Encounter (Signed)
Refill sent to pharm. Pt notified

## 2016-11-08 NOTE — Telephone Encounter (Signed)
Ok 30 d

## 2016-11-11 ENCOUNTER — Encounter: Payer: Self-pay | Admitting: Family Medicine

## 2016-11-11 ENCOUNTER — Ambulatory Visit (INDEPENDENT_AMBULATORY_CARE_PROVIDER_SITE_OTHER): Payer: 59 | Admitting: Family Medicine

## 2016-11-11 VITALS — BP 148/88 | Ht 75.0 in | Wt 396.0 lb

## 2016-11-11 DIAGNOSIS — Q828 Other specified congenital malformations of skin: Secondary | ICD-10-CM | POA: Diagnosis not present

## 2016-11-11 DIAGNOSIS — M79671 Pain in right foot: Secondary | ICD-10-CM | POA: Diagnosis not present

## 2016-11-11 DIAGNOSIS — I10 Essential (primary) hypertension: Secondary | ICD-10-CM

## 2016-11-11 MED ORDER — ENALAPRIL MALEATE 20 MG PO TABS: 20.0000 mg | ORAL_TABLET | Freq: Every day | ORAL | 1 refills | Status: DC

## 2016-11-11 NOTE — Progress Notes (Signed)
   Subjective:    Patient ID: Vincent Hahn, male    DOB: 18-Sep-1975, 41 y.o.   MRN: [id number]  Hypertension    Patient is here today to follow up on HTN. Does not eat healthy or exercise. Takes Enalapril daily. No other concerns.  Blood pressure medicine and blood pressure levels reviewed today with patient. Compliant with blood pressure medicine. States does not miss a dose. No obvious side effects. Blood pressure generally good when checked elsewhere. Watching salt intake.   Handling reflux med well, no obv side effects  Results for orders placed or performed in visit on 10/24/16  Lipid panel  Result Value Ref Range   Cholesterol, Total 180 100 - 199 mg/dL   Triglycerides 165 (H) 0 - 149 mg/dL   HDL 39 (L) >39 mg/dL   VLDL Cholesterol Cal 33 5 - 40 mg/dL   LDL Calculated 108 (H) 0 - 99 mg/dL   Chol/HDL Ratio 4.6 0.0 - 5.0 ratio  Hepatic function panel  Result Value Ref Range   Total Protein 6.8 6.0 - 8.5 g/dL   Albumin 4.2 3.5 - 5.5 g/dL   Bilirubin Total 0.3 0.0 - 1.2 mg/dL   Bilirubin, Direct 0.08 0.00 - 0.40 mg/dL   Alkaline Phosphatase 107 39 - 117 IU/L   AST 18 0 - 40 IU/L   ALT 27 0 - 44 IU/L  Basic metabolic panel  Result Value Ref Range   Glucose 94 65 - 99 mg/dL   BUN 11 6 - 24 mg/dL   Creatinine, Ser 0.80 0.76 - 1.27 mg/dL   GFR calc non Af Amer 111 >59 mL/min/1.73   GFR calc Af Amer 128 >59 mL/min/1.73   BUN/Creatinine Ratio 14 9 - 20   Sodium 142 134 - 144 mmol/L   Potassium 4.8 3.5 - 5.2 mmol/L   Chloride 105 96 - 106 mmol/L   CO2 25 20 - 29 mmol/L   Calcium 9.1 8.7 - 10.2 mg/dL    Review of Systems No headache, no major weight loss or weight gain, no chest pain no back pain abdominal pain no change in bowel habits complete ROS otherwise negative     Objective:   Physical Exam  Alert vitals stable, NAD. Blood pressure good on repeat. HEENT normal. Lungs clear. Heart regular rate and rhythm. Positive morbid obesity. Blood pressure remains  elevated      Assessment & Plan:  Impression hypertension suboptimal control discussed #2 morbid obesity. Weight going in the wrong direction discussion held. Patient may be looking at bariatric intervention down the road. Discussed. Does not want to think about that at this time. Follow-up 6 months for physical plus chronic

## 2016-12-14 ENCOUNTER — Encounter: Payer: Self-pay | Admitting: Nurse Practitioner

## 2016-12-14 ENCOUNTER — Ambulatory Visit (INDEPENDENT_AMBULATORY_CARE_PROVIDER_SITE_OTHER): Payer: 59 | Admitting: Nurse Practitioner

## 2016-12-14 VITALS — BP 128/84 | Ht 75.0 in | Wt >= 6400 oz

## 2016-12-14 DIAGNOSIS — L739 Follicular disorder, unspecified: Secondary | ICD-10-CM

## 2016-12-14 MED ORDER — DOXYCYCLINE HYCLATE 100 MG PO TABS: 100.0000 mg | ORAL_TABLET | Freq: Two times a day (BID) | ORAL | 0 refills | Status: DC

## 2016-12-14 MED ORDER — PREDNISONE 20 MG PO TABS
ORAL_TABLET | ORAL | 0 refills | Status: DC
Start: 2016-12-14 — End: 2017-05-26

## 2016-12-15 ENCOUNTER — Encounter: Payer: Self-pay | Admitting: Nurse Practitioner

## 2016-12-15 NOTE — Progress Notes (Addendum)
Subjective:  Presents for complaints of a rash on his right arm that began 3 days ago. Rash on his right forearm slightly tender. Has a beginning rash in the right wrist area which is very pruritic. No fever. Start off looking like blisters and then ruptures with crusting. No known history of any insect bites. States his son has had a similar lesion on the back of the scalp which is resolving. Has been applying topical Bactroban that he has at home. Has had a history of recurrent folliculitis in the scalp.   Objective:   BP 128/84   Ht 6' 3"  (1.905 m)   Wt (!) 400 lb (181.4 kg)   BMI 50.00 kg/m  NAD. Alert, oriented. Lungs clear. Heart regular rate rhythm. Multiple scars noted in the occipital area of the scalp from previous infections. One small pink papule noted, no pustules. On the right forearm a discrete cluster of open erythematous lesions some with slight crusting noted. Faint erythema noted around this area with mild edema and tenderness. Another cluster of lesions are noted in the wrist area nontender to palpation. One closed postural noted.   Assessment:  Folliculitis with secondary cellulitis/possible MRSA    Plan:   Meds ordered this encounter  Medications  . doxycycline (VIBRA-TABS) 100 MG tablet    Sig: Take 1 tablet (100 mg total) by mouth 2 (two) times daily.    Dispense:  20 tablet    Refill:  0    Order Specific Question:   Supervising Provider    Answer:   Mikey Kirschner [[id number]]  . predniSONE (DELTASONE) 20 MG tablet    Sig: 3 po qd x 3 d then 2 po qd x 3 d then 1 po qd x 2 d    Dispense:  17 tablet    Refill:  0    Order Specific Question:   Supervising Provider    Answer:   Mikey Kirschner [[id number]]   Due to pruritic nature of rash as well as spread will also cover for possible contact dermatitis. Continue Bactroban ointment a small amount in each nostril twice a day for the next several days. Warning signs reviewed. Call back in 48 hours if no improvement, sooner  if worse.

## 2017-04-29 ENCOUNTER — Other Ambulatory Visit: Payer: Self-pay | Admitting: Family Medicine

## 2017-05-26 ENCOUNTER — Ambulatory Visit: Payer: 59 | Admitting: Family Medicine

## 2017-05-26 ENCOUNTER — Encounter: Payer: Self-pay | Admitting: Family Medicine

## 2017-05-26 VITALS — BP 168/98 | Ht 75.0 in | Wt >= 6400 oz

## 2017-05-26 DIAGNOSIS — Z0000 Encounter for general adult medical examination without abnormal findings: Secondary | ICD-10-CM

## 2017-05-26 DIAGNOSIS — Z13220 Encounter for screening for lipoid disorders: Secondary | ICD-10-CM | POA: Diagnosis not present

## 2017-05-26 DIAGNOSIS — Z0001 Encounter for general adult medical examination with abnormal findings: Secondary | ICD-10-CM | POA: Diagnosis not present

## 2017-05-26 DIAGNOSIS — Z79899 Other long term (current) drug therapy: Secondary | ICD-10-CM

## 2017-05-26 DIAGNOSIS — I10 Essential (primary) hypertension: Secondary | ICD-10-CM | POA: Diagnosis not present

## 2017-05-26 MED ORDER — ENALAPRIL MALEATE 20 MG PO TABS: 20.0000 mg | ORAL_TABLET | Freq: Two times a day (BID) | ORAL | 1 refills | Status: DC

## 2017-05-26 MED ORDER — NAPROXEN 500 MG PO TABS: 500.0000 mg | ORAL_TABLET | Freq: Two times a day (BID) | ORAL | 2 refills | Status: DC

## 2017-05-26 NOTE — Progress Notes (Signed)
Subjective:    Patient ID: Vincent Hahn, male    DOB: [birth year], 42 y.o.   MRN: [id number]  HPI Patient is here today for Physical and chronic health concerns. He is here to follow up on Htn in which he takes Enalapril 20 mg one po QHS. The patient comes in today for a wellness visit.  A review of their health history was completed.  A review of medications was also completed.  Any needed refills; Maybe  Eating habits: trying to eat healthier  Falls/  MVA accidents in past few months: No  Regular exercise: some  Specialist pt sees on regular basis: No  Preventative health issues were discussed.   Additional concerns: None  Results for orders placed or performed in visit on 10/24/16  Lipid panel  Result Value Ref Range   Cholesterol, Total 180 100 - 199 mg/dL   Triglycerides 165 (H) 0 - 149 mg/dL   HDL 39 (L) >39 mg/dL   VLDL Cholesterol Cal 33 5 - 40 mg/dL   LDL Calculated 108 (H) 0 - 99 mg/dL   Chol/HDL Ratio 4.6 0.0 - 5.0 ratio  Hepatic function panel  Result Value Ref Range   Total Protein 6.8 6.0 - 8.5 g/dL   Albumin 4.2 3.5 - 5.5 g/dL   Bilirubin Total 0.3 0.0 - 1.2 mg/dL   Bilirubin, Direct 0.08 0.00 - 0.40 mg/dL   Alkaline Phosphatase 107 39 - 117 IU/L   AST 18 0 - 40 IU/L   ALT 27 0 - 44 IU/L  Basic metabolic panel  Result Value Ref Range   Glucose 94 65 - 99 mg/dL   BUN 11 6 - 24 mg/dL   Creatinine, Ser 0.80 0.76 - 1.27 mg/dL   GFR calc non Af Amer 111 >59 mL/min/1.73   GFR calc Af Amer 128 >59 mL/min/1.73   BUN/Creatinine Ratio 14 9 - 20   Sodium 142 134 - 144 mmol/L   Potassium 4.8 3.5 - 5.2 mmol/L   Chloride 105 96 - 106 mmol/L   CO2 25 20 - 29 mmol/L   Calcium 9.1 8.7 - 10.2 mg/dL   Blood pressure medicine and blood pressure levels reviewed today with patient. Compliant with blood pressure medicine. States does not miss a dose. No obvious side effects. Blood pressure generally good when checked elsewhere. Watching salt intake.  Father hx  of very heavy weight, sig obesity in the family   Review of Systems  Constitutional: Negative for activity change, appetite change and fever.  HENT: Negative for congestion and rhinorrhea.   Eyes: Negative for discharge.  Respiratory: Negative for cough and wheezing.   Cardiovascular: Negative for chest pain.  Gastrointestinal: Negative for abdominal pain, blood in stool and vomiting.  Genitourinary: Negative for difficulty urinating and frequency.  Musculoskeletal: Negative for neck pain.  Skin: Negative for rash.  Allergic/Immunologic: Negative for environmental allergies and food allergies.  Neurological: Negative for weakness and headaches.  Psychiatric/Behavioral: Negative for agitation.  All other systems reviewed and are negative.      Objective:   Physical Exam  Constitutional: He appears well-developed and well-nourished.  Morbid obesity present  HENT:  Head: Normocephalic and atraumatic.  Right Ear: External ear normal.  Left Ear: External ear normal.  Nose: Nose normal.  Mouth/Throat: Oropharynx is clear and moist.  Eyes: Right eye exhibits no discharge. Left eye exhibits no discharge. No scleral icterus.  Neck: Normal range of motion. Neck supple. No thyromegaly present.  Cardiovascular: Normal rate, regular  rhythm and normal heart sounds.  No murmur heard. Pulmonary/Chest: Effort normal and breath sounds normal. No respiratory distress. He has no wheezes.  Abdominal: Soft. Bowel sounds are normal. He exhibits no distension and no mass. There is no tenderness.  Genitourinary: Penis normal.  Musculoskeletal: Normal range of motion. He exhibits no edema.  Lymphadenopathy:    He has no cervical adenopathy.  Neurological: He is alert. He exhibits normal muscle tone. Coordination normal.  Skin: Skin is warm and dry. No erythema.  Psychiatric: He has a normal mood and affect. His behavior is normal. Judgment normal.  Vitals reviewed.         Assessment & Plan:    Impression wellness exam.  Diet discussed exercise discussed.  Anticipatory guidance given  2.  Hypertension blood pressure on.  Improved will maintain same  3.  Morbid obesity.  Very long discussion held.  I think time for bariatric referral patient would like to try one more 6 months to 12 months.  On his own feels he can do it diet exercise discussed at lengthIs the  Appropriate blood work for sedation results medications refilled

## 2017-06-10 DIAGNOSIS — Z13220 Encounter for screening for lipoid disorders: Secondary | ICD-10-CM | POA: Diagnosis not present

## 2017-06-10 DIAGNOSIS — Z79899 Other long term (current) drug therapy: Secondary | ICD-10-CM | POA: Diagnosis not present

## 2017-06-11 LAB — BASIC METABOLIC PANEL
BUN/Creatinine Ratio: 13 (ref 9–20)
BUN: 10 mg/dL (ref 6–24)
CALCIUM: 8.9 mg/dL (ref 8.7–10.2)
CHLORIDE: 103 mmol/L (ref 96–106)
CO2: 27 mmol/L (ref 20–29)
CREATININE: 0.8 mg/dL (ref 0.76–1.27)
GFR calc Af Amer: 127 mL/min/{1.73_m2} (ref >59)
GFR calc non Af Amer: 110 mL/min/{1.73_m2} (ref >59)
Glucose: 98 mg/dL (ref 65–99)
Potassium: 5.3 mmol/L — ABNORMAL HIGH (ref 3.5–5.2)
Sodium: 141 mmol/L (ref 134–144)

## 2017-06-11 LAB — LIPID PANEL
Chol/HDL Ratio: 5.3 ratio — ABNORMAL HIGH (ref 0.0–5.0)
Cholesterol, Total: 190 mg/dL (ref 100–199)
HDL: 36 mg/dL — ABNORMAL LOW (ref >39)
LDL Calculated: 110 mg/dL — ABNORMAL HIGH (ref 0–99)
Triglycerides: 221 mg/dL — ABNORMAL HIGH (ref 0–149)
VLDL CHOLESTEROL CAL: 44 mg/dL — AB (ref 5–40)

## 2017-06-11 LAB — HEPATIC FUNCTION PANEL
ALT: 25 IU/L (ref 0–44)
AST: 14 IU/L (ref 0–40)
Albumin: 4 g/dL (ref 3.5–5.5)
Alkaline Phosphatase: 104 IU/L (ref 39–117)
BILIRUBIN TOTAL: 0.2 mg/dL (ref 0.0–1.2)
BILIRUBIN, DIRECT: 0.07 mg/dL (ref 0.00–0.40)
Total Protein: 6.7 g/dL (ref 6.0–8.5)

## 2017-06-14 ENCOUNTER — Encounter: Payer: Self-pay | Admitting: Family Medicine

## 2017-07-15 ENCOUNTER — Other Ambulatory Visit: Payer: Self-pay | Admitting: Family Medicine

## 2017-08-21 ENCOUNTER — Other Ambulatory Visit: Payer: Self-pay | Admitting: Family Medicine

## 2017-11-07 ENCOUNTER — Telehealth: Payer: Self-pay | Admitting: Family Medicine

## 2017-11-07 ENCOUNTER — Other Ambulatory Visit: Payer: Self-pay | Admitting: *Deleted

## 2017-11-07 MED ORDER — ENALAPRIL MALEATE 20 MG PO TABS: ORAL_TABLET | ORAL | 0 refills | Status: DC

## 2017-11-07 NOTE — Telephone Encounter (Signed)
Patient is requesting refill enalapril 20 mg last filled 5/20 19 for 90 tablets at [address].

## 2017-11-07 NOTE — Telephone Encounter (Signed)
Pt last seen feb and has an upcoming appt aug 30th. 90 day supply sent to pharm per dr Richardson Landry. Pt notified.

## 2017-11-24 ENCOUNTER — Ambulatory Visit: Payer: 59 | Admitting: Family Medicine

## 2017-12-01 ENCOUNTER — Encounter: Payer: Self-pay | Admitting: Family Medicine

## 2017-12-01 ENCOUNTER — Ambulatory Visit: Payer: 59 | Admitting: Family Medicine

## 2017-12-01 VITALS — BP 144/92 | Ht 75.0 in | Wt >= 6400 oz

## 2017-12-01 DIAGNOSIS — F4321 Adjustment disorder with depressed mood: Secondary | ICD-10-CM | POA: Diagnosis not present

## 2017-12-01 DIAGNOSIS — J310 Chronic rhinitis: Secondary | ICD-10-CM

## 2017-12-01 DIAGNOSIS — J329 Chronic sinusitis, unspecified: Secondary | ICD-10-CM | POA: Diagnosis not present

## 2017-12-01 DIAGNOSIS — I10 Essential (primary) hypertension: Secondary | ICD-10-CM | POA: Diagnosis not present

## 2017-12-01 MED ORDER — ENALAPRIL MALEATE 20 MG PO TABS: ORAL_TABLET | ORAL | 1 refills | Status: DC

## 2017-12-01 MED ORDER — CEFPROZIL 500 MG PO TABS: 500.0000 mg | ORAL_TABLET | Freq: Two times a day (BID) | ORAL | 0 refills | Status: DC

## 2017-12-01 NOTE — Progress Notes (Signed)
   Subjective:    Patient ID: Vincent Hahn, male    DOB: 1975-10-13, 42 y.o.   MRN: [id number]  Hypertension  This is a chronic problem. Compliance problems include exercise and diet (has not done well the past 6 months since father passed away).    Blood pressure medicine and blood pressure levels reviewed today with patient. Compliant with blood pressure medicine. States does not miss a dose. No obvious side effects. Blood pressure generally good when checked elsewhere. Watching salt intake.  Patient notes congestion.  Stuffiness.  Drainage.  Sore throat.  Off and on.  Worse in recent days.  Also under a lot of stress.  Unfortunately father passed away a couple months ago.  He has been quite sick for the last few months.  This is put a lot of strain understandably on the patient.  Feels down.  Crying spells.  Diminished energy.  Unfortunately continues to gain weight.  Not exercising a lot these days.  Working hard    Review of Systems No headache, no major weight loss or weight gain, no chest pain no back pain abdominal pain no change in bowel habits complete ROS otherwise negative     Objective:   Physical Exam  Alert and oriented, vitals reviewed and stable, NAD ENT-TM's and ext canals/frontal congestion and nasal pinkness and discharge.  WNL bilat via otoscopic exam Soft palate, tonsils and post pharynx WNL via oropharyngeal exam Neck-symmetric, no masses; thyroid nonpalpable and nontender Pulmonary-no tachypnea or accessory muscle use; Clear without wheezes via auscultation Card--no abnrml murmurs, rhythm reg and rate WNL Carotid pulses symmetric, without bruits       Assessment & Plan:  Impression hypertension.  Good control discussed maintain same therapy  2.  Rhinosinusitis.  Advised prescribed symptom care discussed  3.  Morbid obesity.  Ongoing challenges discussed patient continues to work on pains  4.  Grief/depression.  Discussed.  Patient wishes to hold  off her medication now discussed.  If in future would like to start he can contact us  Greater than 50% of this 25 minute face to face visit was spent in counseling and discussion and coordination of care regarding the above diagnosis/diagnosies  Follow-up in 6 months for wellness plus chronic

## 2018-05-29 ENCOUNTER — Other Ambulatory Visit: Payer: Self-pay | Admitting: Family Medicine

## 2018-06-04 ENCOUNTER — Ambulatory Visit: Payer: Self-pay | Admitting: Family Medicine

## 2018-06-04 ENCOUNTER — Encounter: Payer: Self-pay | Admitting: Family Medicine

## 2018-06-04 VITALS — BP 152/88 | Ht 75.0 in | Wt >= 6400 oz

## 2018-06-04 DIAGNOSIS — J329 Chronic sinusitis, unspecified: Secondary | ICD-10-CM

## 2018-06-04 DIAGNOSIS — I10 Essential (primary) hypertension: Secondary | ICD-10-CM

## 2018-06-04 MED ORDER — ENALAPRIL MALEATE 20 MG PO TABS: ORAL_TABLET | ORAL | 1 refills | Status: DC

## 2018-06-04 MED ORDER — AMOXICILLIN 500 MG PO CAPS: 500.0000 mg | ORAL_CAPSULE | Freq: Three times a day (TID) | ORAL | 0 refills | Status: DC

## 2018-06-04 MED ORDER — HYDROCHLOROTHIAZIDE 25 MG PO TABS: 12.5000 mg | ORAL_TABLET | Freq: Every day | ORAL | 1 refills | Status: DC

## 2018-06-04 NOTE — Progress Notes (Signed)
   Subjective:    Patient ID: Vincent Hahn, male    DOB: 04-30-1975, 43 y.o.   MRN: [id number]  Hypertension  This is a chronic problem. The current episode started more than 1 year ago. Risk factors for coronary artery disease include obesity and male gender. Treatments tried: vasotec. There are no compliance problems.    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 Compliant with blood pressure medicine. States does not miss a dose. No obvious side effects. Blood pressure generally good when checked elsewhere. Watching salt intake.  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 Blood pressure not good on repeat. HEENT positive nasal congestion normal. Lungs clear. Heart regular rate and rhythm.       Assessment & Plan:  Impression hypertension.  Suboptimal control.  Under a lot of stress.  Lost his job.  Also lost his father.  Mother now committed.  Add one half of a hydrochlorothiazide 25 daily to patient's current blood pressure regimen.  In addition will cover with amoxicillin for greater than 7 days of nasal congestion.  Patient now very morbidly obese no insurance and does not want referrals in this regard.  Recheck in 6 months

## 2018-10-30 ENCOUNTER — Ambulatory Visit (INDEPENDENT_AMBULATORY_CARE_PROVIDER_SITE_OTHER): Payer: Self-pay | Admitting: Family Medicine

## 2018-10-30 ENCOUNTER — Other Ambulatory Visit: Payer: Self-pay

## 2018-10-30 DIAGNOSIS — J019 Acute sinusitis, unspecified: Secondary | ICD-10-CM

## 2018-10-30 MED ORDER — AMOXICILLIN-POT CLAVULANATE 875-125 MG PO TABS: 1.0000 | ORAL_TABLET | Freq: Two times a day (BID) | ORAL | 0 refills | Status: DC

## 2018-10-30 NOTE — Progress Notes (Signed)
   Subjective:    Patient ID: Vincent Hahn, male    DOB: 28-Feb-1976, 43 y.o.   MRN: [id number]  Sore Throat  This is a new problem. The current episode started 1 to 4 weeks ago. Pertinent negatives include no congestion.   Was taking left over antibiotics at home that helped but it came back Patient relates he has had a few weeks of some sore throat congestion drainage some coughing he also relates his sinuses ache his teeth are aching pain he did take amoxicillin for a few days he got better but now he started to get more symptoms he denies any high fever muscle aches sweats chills does not get out around other people so he does not feel he has coronavirus  Review of Systems  HENT: Negative for congestion.    Virtual Visit via Video Note  I connected with Vincent Hahn on 10/30/18 at  4:20 PM EDT by a video enabled telemedicine application and verified that I am speaking with the correct person using two identifiers.  Location: Patient: home Provider: office   I discussed the limitations of evaluation and management by telemedicine and the availability of in person appointments. The patient expressed understanding and agreed to proceed.  History of Present Illness:    Observations/Objective:   Assessment and Plan:   Follow Up Instructions:    I discussed the assessment and treatment plan with the patient. The patient was provided an opportunity to ask questions and all were answered. The patient agreed with the plan and demonstrated an understanding of the instructions.   The patient was advised to call back or seek an in-person evaluation if the symptoms worsen or if the condition fails to improve as anticipated.  I provided 15 minutes of non-face-to-face time during this encounter.        Objective:   Physical Exam Patient had virtual visit Appears to be in no distress Atraumatic Neuro able to relate and oriented No apparent resp distress Color normal         Assessment & Plan:  Patient was seen today for upper respiratory illness. It is felt that the patient is dealing with sinusitis.  Antibiotics were prescribed today. Importance of compliance with medication was discussed.  Symptoms should gradually resolve over the course of the next several days. If high fevers, progressive illness, difficulty breathing, worsening condition or failure for symptoms to improve over the next several days then the patient is to follow-up.  If any emergent conditions the patient is to follow-up in the emergency department otherwise to follow-up in the office. Warning signs were discussed with the patient I do not feel the patient has coronavirus I did discuss the possibility of coronavirus with him and testing if he starts developing muscle aches fever chills or sweats

## 2018-12-05 ENCOUNTER — Other Ambulatory Visit: Payer: Self-pay

## 2018-12-05 ENCOUNTER — Ambulatory Visit (INDEPENDENT_AMBULATORY_CARE_PROVIDER_SITE_OTHER): Payer: Self-pay | Admitting: Family Medicine

## 2018-12-05 ENCOUNTER — Encounter: Payer: Self-pay | Admitting: Family Medicine

## 2018-12-05 DIAGNOSIS — I10 Essential (primary) hypertension: Secondary | ICD-10-CM

## 2018-12-05 MED ORDER — ENALAPRIL MALEATE 20 MG PO TABS: ORAL_TABLET | ORAL | 1 refills | Status: DC

## 2018-12-05 NOTE — Progress Notes (Signed)
   Subjective:  Audio plus video  Patient ID: Vincent Hahn, male    DOB: [birth year], 43 y.o.   MRN: [id number]  Pt has not checked bp.   Hypertension This is a chronic problem. Treatments tried: enalapril 35m. Compliance problems include exercise (takes meds most days. forgets every once in a while, should do more exercise, ).   stopped taking hctz a couple of months ago because he thought it was making him feel bad but he has felt the same way since stopping so he would like to discuss starting back on the medication. States he is feeling bad due to some problems with gerd. Takes prevacid 368mdaily.   Virtual Visit via Video Note  I connected with Vincent Hahn 12/05/18 at 10:00 AM EDT by a video enabled telemedicine application and verified that I am speaking with the correct person using two identifiers.  Location: Patient: home Provider: office   I discussed the limitations of evaluation and management by telemedicine and the availability of in person appointments. The patient expressed understanding and agreed to proceed.  History of Present Illness:    Observations/Objective:   Assessment and Plan:   Follow Up Instructions:    I discussed the assessment and treatment plan with the patient. The patient was provided an opportunity to ask questions and all were answered. The patient agreed with the plan and demonstrated an understanding of the instructions.   The patient was advised to call back or seek an in-person evaluation if the symptoms worsen or if the condition fails to improve as anticipated.  I provided 18 minutes of non-face-to-face time during this encounter.   Patient was felt lightheaded at times.  He stopped the HCTZ.  Still fell on it.  Wonders if he should resume both medicines.  Blood pressure was high before.  Does not check his own blood pressures.  Reflux worsening.  In the past would add Zantac to Prevacid.  Not available.  Wonders what else  he could utilize   Review of Systems No headache, no major weight loss or weight gain, no chest pain no back pain abdominal pain no change in bowel habits complete ROS otherwise negative     Objective:   Physical Exam Virtual       Assessment & Plan:  Impression 1 reflux unsatisfactory control discussed may add over-the-counter Pepcid to the baseline Prevacid  2.  Hypertension.  Encouraged to resume hydrochlorothiazide.  Keep an eye out for orthostatic symptoms.  If these reemerge [place] then we will stop the HCTZ and provide an alternative  Diet exercise discussed at length  Follow-up in 6 months for chronic problems plus wellness

## 2019-02-20 ENCOUNTER — Other Ambulatory Visit: Payer: Self-pay | Admitting: Family Medicine

## 2019-05-06 ENCOUNTER — Encounter: Payer: Self-pay | Admitting: Family Medicine

## 2019-05-20 ENCOUNTER — Other Ambulatory Visit: Payer: Self-pay | Admitting: Family Medicine

## 2019-06-10 ENCOUNTER — Other Ambulatory Visit: Payer: Self-pay | Admitting: Family Medicine

## 2019-06-10 NOTE — Telephone Encounter (Signed)
CPE scheduled 3/31  Does pt need lab work?

## 2019-06-10 NOTE — Telephone Encounter (Signed)
Last labs 06/10/17 lipid, liver, bmp

## 2019-06-10 NOTE — Telephone Encounter (Signed)
Same Ref times one

## 2019-06-10 NOTE — Telephone Encounter (Signed)
Schedule visit and then route back to nurses to send in one refill

## 2019-06-11 ENCOUNTER — Telehealth: Payer: Self-pay | Admitting: *Deleted

## 2019-06-11 ENCOUNTER — Other Ambulatory Visit: Payer: Self-pay | Admitting: *Deleted

## 2019-06-11 DIAGNOSIS — Z13220 Encounter for screening for lipoid disorders: Secondary | ICD-10-CM

## 2019-06-11 DIAGNOSIS — Z79899 Other long term (current) drug therapy: Secondary | ICD-10-CM

## 2019-06-11 DIAGNOSIS — I10 Essential (primary) hypertension: Secondary | ICD-10-CM

## 2019-06-11 NOTE — Telephone Encounter (Signed)
Pt was in rx request and appt was scheduled and dr Richardson Landry wanted pt to do bw. Orders are put in and I left a message to return call to let him know to do bw before his visit.

## 2019-06-11 NOTE — Telephone Encounter (Signed)
Pt returned call and verbalized understanding. 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 Pt states he doesn't mind doing the blood work but doesn't want to do 2 physicals so close together. Pt changed to 6 month follow up.

## 2019-06-26 ENCOUNTER — Other Ambulatory Visit: Payer: Self-pay

## 2019-06-26 ENCOUNTER — Ambulatory Visit: Payer: Managed Care, Other (non HMO) | Attending: Internal Medicine

## 2019-06-26 DIAGNOSIS — Z20822 Contact with and (suspected) exposure to covid-19: Secondary | ICD-10-CM

## 2019-06-26 DIAGNOSIS — U071 COVID-19: Secondary | ICD-10-CM | POA: Insufficient documentation

## 2019-06-27 ENCOUNTER — Ambulatory Visit (INDEPENDENT_AMBULATORY_CARE_PROVIDER_SITE_OTHER): Payer: 59 | Admitting: Family Medicine

## 2019-06-27 ENCOUNTER — Telehealth: Payer: Self-pay | Admitting: Family Medicine

## 2019-06-27 ENCOUNTER — Telehealth: Payer: Self-pay | Admitting: *Deleted

## 2019-06-27 ENCOUNTER — Other Ambulatory Visit: Payer: Self-pay | Admitting: Adult Health

## 2019-06-27 DIAGNOSIS — U071 COVID-19: Secondary | ICD-10-CM

## 2019-06-27 LAB — SARS-COV-2, NAA 2 DAY TAT

## 2019-06-27 LAB — NOVEL CORONAVIRUS, NAA: SARS-CoV-2, NAA: DETECTED — AB

## 2019-06-27 NOTE — Telephone Encounter (Signed)
Well address this eve

## 2019-06-27 NOTE — Progress Notes (Signed)
   Subjective:  Audio plus video  Patient ID: Vincent Hahn, male    DOB: 1975/04/06, 44 y.o.   MRN: [id number]  HPI Pt received test results this morning that he was positive for COVID. Pt began to have symptoms last Friday. Pt unable to smell or taste, stuffy head, headache sometimes but not often. Symptoms have become better. Pt only takes Tylenol when having headache.   Virtual Visit via Video Note  I connected with [name] on 06/27/19 at  4:10 PM EDT by a video enabled telemedicine application and verified that I am speaking with the correct person using two identifiers.  Location: Patient: home Provider: office   I discussed the limitations of evaluation and management by telemedicine and the availability of in person appointments. The patient expressed understanding and agreed to proceed.  History of Present Illness:    Observations/Objective:   Assessment and Plan:   Follow Up Instructions:    I discussed the assessment and treatment plan with the patient. The patient was provided an opportunity to ask questions and all were answered. The patient agreed with the plan and demonstrated an understanding of the instructions.   The patient was advised to call back or seek an in-person evaluation if the symptoms worsen or if the condition fails to improve as anticipated.  I provided 23 minutes of non-face-to-face time during this encounter.   No major shortness of breath at this time.    Review of Systems   See above    Objective:   Physical Exam  Virtual      Assessment & Plan:  Impression 1 COVID-19 infection with acute symptomatology.  I am concerned with this patient's history of hypertension plus morbid obesity, running of 424 pound weight at last check.  Discussion held.  Strongly recommend Covid infusion rationale discussed.  Number given patient to call addendum patient had his infusion and 2 days post is feeling better and better

## 2019-06-27 NOTE — Progress Notes (Signed)
  I connected by phone with Vincent Hahn on 06/27/2019 at 5:52 PM to discuss the potential use of an new treatment for mild to moderate COVID-19 viral infection in non-hospitalized patients.  This patient is a 43 y.o. male that meets the FDA criteria for Emergency Use Authorization of bamlanivimab or casirivimab\imdevimab.  Has a (+) direct SARS-CoV-2 viral test result  Has mild or moderate COVID-19   Is ? 44 years of age and weighs ? 40 kg  Is NOT hospitalized due to COVID-19  Is NOT requiring oxygen therapy or requiring an increase in baseline oxygen flow rate due to COVID-19  Is within 10 days of symptom onset  Has at least one of the high risk factor(s) for progression to severe COVID-19 and/or hospitalization as defined in EUA.  Specific high risk criteria : BMI >/= 35   I have spoken and communicated the following to the patient or parent/caregiver:  1. FDA has authorized the emergency use of bamlanivimab and casirivimab\imdevimab for the treatment of mild to moderate COVID-19 in adults and pediatric patients with positive results of direct SARS-CoV-2 viral testing who are 57 years of age and older weighing at least 40 kg, and who are at high risk for progressing to severe COVID-19 and/or hospitalization.  2. The significant known and potential risks and benefits of bamlanivimab and casirivimab\imdevimab, and the extent to which such potential risks and benefits are unknown.  3. Information on available alternative treatments and the risks and benefits of those alternatives, including clinical trials.  4. Patients treated with bamlanivimab and casirivimab\imdevimab should continue to self-isolate and use infection control measures (e.g., wear mask, isolate, social distance, avoid sharing personal items, clean and disinfect "high touch" surfaces, and frequent handwashing) according to CDC guidelines.   5. The patient or parent/caregiver has the option to accept or refuse  bamlanivimab or casirivimab\imdevimab .  After reviewing this information with the patient, The patient agreed to proceed with receiving the bamlanimivab infusion and will be provided a copy of the Fact sheet prior to receiving the infusion.   Scheduled for 3/26 at 1230 .  Marland Kitchen Vincent Hahn 06/27/2019 5:52 PM

## 2019-06-27 NOTE — Telephone Encounter (Signed)
Pt has an appt this afternoon with Dr. Richardson Landry for a virtual visit due to being COVID+. He started having symptoms on 3/19. He got tested yesterday and results came back today as positive. He has a virtual on 3/31 with Dr. Richardson Landry for his 6 month follow up wants to know when he can get lab work done or if he should wait until after his appt.

## 2019-06-27 NOTE — Telephone Encounter (Signed)
Pt returned call regarding his positive covid 19 test result. He received the MyChart message regarding recommendations for positive covid. He voiced understanding and that he has had only loss of taste and smell and it has been day 7 of his quarantine. Stressed to him that any symptoms could come up during his quarantine, voiced understanding. He will notify his provider of this test results.

## 2019-06-28 ENCOUNTER — Ambulatory Visit (HOSPITAL_COMMUNITY)
Admission: RE | Admit: 2019-06-28 | Discharge: 2019-06-28 | Disposition: A | Discharge status: Home / Self Care | Payer: HRSA Program | Source: Ambulatory Visit | Attending: Pulmonary Disease | Admitting: Pulmonary Disease

## 2019-06-28 DIAGNOSIS — U071 COVID-19: Secondary | ICD-10-CM | POA: Diagnosis not present

## 2019-06-28 MED ORDER — METHYLPREDNISOLONE SODIUM SUCC 125 MG IJ SOLR: 125.0000 mg | Freq: Once | INTRAMUSCULAR | Status: DC | PRN

## 2019-06-28 MED ORDER — SODIUM CHLORIDE 0.9 % IV SOLN: INTRAVENOUS | Status: DC | PRN

## 2019-06-28 MED ORDER — SODIUM CHLORIDE 0.9 % IV SOLN
700.0000 mg | Freq: Once | INTRAVENOUS | Status: AC
  Administered 2019-06-28: 13:00:00 700 mg via INTRAVENOUS
  Filled 2019-06-28: qty 700

## 2019-06-28 MED ORDER — EPINEPHRINE 0.3 MG/0.3ML IJ SOAJ: 0.3000 mg | Freq: Once | INTRAMUSCULAR | Status: DC | PRN

## 2019-06-28 MED ORDER — DIPHENHYDRAMINE HCL 50 MG/ML IJ SOLN: 50.0000 mg | Freq: Once | INTRAMUSCULAR | Status: DC | PRN

## 2019-06-28 MED ORDER — FAMOTIDINE IN NACL 20-0.9 MG/50ML-% IV SOLN: 20.0000 mg | Freq: Once | INTRAVENOUS | Status: DC | PRN

## 2019-06-28 MED ORDER — ALBUTEROL SULFATE HFA 108 (90 BASE) MCG/ACT IN AERS: 2.0000 | INHALATION_SPRAY | Freq: Once | RESPIRATORY_TRACT | Status: DC | PRN

## 2019-06-28 NOTE — Progress Notes (Signed)
  Diagnosis: COVID-19  Physician:dr wright   Procedure: [facility] Med: bamlanivimab infusion - Provided patient with bamlanimivab fact sheet for patients, parents and caregivers prior to infusion.  Complications: No immediate complications noted.  Discharge: Discharged home   Vincent Hahn 06/28/2019

## 2019-06-28 NOTE — Discharge Instructions (Signed)
COVID-19 COVID-19 is a respiratory infection that is caused by a virus called severe acute respiratory syndrome coronavirus 2 (SARS-CoV-2). The disease is also known as coronavirus disease or novel coronavirus. In some people, the virus may not cause any symptoms. In others, it may cause a serious infection. The infection can get worse quickly and can lead to complications, such as:  Pneumonia, or infection of the lungs.  Acute respiratory distress syndrome or ARDS. This is a condition in which fluid build-up in the lungs prevents the lungs from filling with air and passing oxygen into the blood.  Acute respiratory failure. This is a condition in which there is not enough oxygen passing from the lungs to the body or when carbon dioxide is not passing from the lungs out of the body.  Sepsis or septic shock. This is a serious bodily reaction to an infection.  Blood clotting problems.  Secondary infections due to bacteria or fungus.  Organ failure. This is when your body's organs stop working. The virus that causes COVID-19 is contagious. This means that it can spread from person to person through droplets from coughs and sneezes (respiratory secretions). What are the causes? This illness is caused by a virus. You may catch the virus by: What types of side effects do monoclonal antibody drugs cause?  Common side effects  In general, the more common side effects caused by monoclonal antibody drugs include: . Allergic reactions, such as hives or itching . Flu-like signs and symptoms, including chills, fatigue, fever, and muscle aches and pains . Nausea, vomiting . Diarrhea . Skin rashes . Low blood pressure   The CDC is recommending patients who receive monoclonal antibody treatments wait at least 90 days before being vaccinated.  Currently, there are no data on the safety and efficacy of mRNA COVID-19 vaccines in persons who received monoclonal antibodies or convalescent plasma as part  of COVID-19 treatment. Based on the estimated half-life of such therapies as well as evidence suggesting that reinfection is uncommon in the 90 days after initial infection, vaccination should be deferred for at least 90 days, as a precautionary measure until additional information becomes available, to avoid interference of the antibody treatment with vaccine-induced immune responses.  Breathing in droplets from an infected person. Droplets can be spread by a person breathing, speaking, singing, coughing, or sneezing.  Touching something, like a table or a doorknob, that was exposed to the virus (contaminated) and then touching your mouth, nose, or eyes. What increases the risk? Risk for infection You are more likely to be infected with this virus if you:  Are within 6 feet (2 meters) of a person with COVID-19.  Provide care for or live with a person who is infected with COVID-19.  Spend time in crowded indoor spaces or live in shared housing. Risk for serious illness You are more likely to become seriously ill from the virus if you:  Are 72 years of age or older. The higher your age, the more you are at risk for serious illness.  Live in a nursing home or long-term care facility.  Have cancer.  Have a long-term (chronic) disease such as: ? Chronic lung disease, including chronic obstructive pulmonary disease or asthma. ? A long-term disease that lowers your body's ability to fight infection (immunocompromised). ? Heart disease, including heart failure, a condition in which the arteries that lead to the heart become narrow or blocked (coronary artery disease), a disease which makes the heart muscle thick, weak, or stiff (  cardiomyopathy). ? Diabetes. ? Chronic kidney disease. ? Sickle cell disease, a condition in which red blood cells have an abnormal "sickle" shape. ? Liver disease.  Are obese. What are the signs or symptoms? Symptoms of this condition can range from mild to  severe. Symptoms may appear any time from 2 to 14 days after being exposed to the virus. They include:  A fever or chills.  A cough.  Difficulty breathing.  Headaches, body aches, or muscle aches.  Runny or stuffy (congested) nose.  A sore throat.  New loss of taste or smell. Some people may also have stomach problems, such as nausea, vomiting, or diarrhea. Other people may not have any symptoms of COVID-19. How is this diagnosed? This condition may be diagnosed based on:  Your signs and symptoms, especially if: ? You live in an area with a COVID-19 outbreak. ? You recently traveled to or from an area where the virus is common. ? You provide care for or live with a person who was diagnosed with COVID-19. ? You were exposed to a person who was diagnosed with COVID-19.  A physical exam.  Lab tests, which may include: ? Taking a sample of fluid from the back of your nose and throat (nasopharyngeal fluid), your nose, or your throat using a swab. ? A sample of mucus from your lungs (sputum). ? Blood tests.  Imaging tests, which may include, X-rays, CT scan, or ultrasound. How is this treated? At present, there is no medicine to treat COVID-19. Medicines that treat other diseases are being used on a trial basis to see if they are effective against COVID-19. Your health care provider will talk with you about ways to treat your symptoms. For most people, the infection is mild and can be managed at home with rest, fluids, and over-the-counter medicines. Treatment for a serious infection usually takes places in a hospital intensive care unit (ICU). It may include one or more of the following treatments. These treatments are given until your symptoms improve.  Receiving fluids and medicines through an IV.  Supplemental oxygen. Extra oxygen is given through a tube in the nose, a face mask, or a hood.  Positioning you to lie on your stomach (prone position). This makes it easier for  oxygen to get into the lungs.  Continuous positive airway pressure (CPAP) or bi-level positive airway pressure (BPAP) machine. This treatment uses mild air pressure to keep the airways open. A tube that is connected to a motor delivers oxygen to the body.  Ventilator. This treatment moves air into and out of the lungs by using a tube that is placed in your windpipe.  Tracheostomy. This is a procedure to create a hole in the neck so that a breathing tube can be inserted.  Extracorporeal membrane oxygenation (ECMO). This procedure gives the lungs a chance to recover by taking over the functions of the heart and lungs. It supplies oxygen to the body and removes carbon dioxide. Follow these instructions at home: Lifestyle  If you are sick, stay home except to get medical care. Your health care provider will tell you how long to stay home. Call your health care provider before you go for medical care.  Rest at home as told by your health care provider.  Do not use any products that contain nicotine or tobacco, such as cigarettes, e-cigarettes, and chewing tobacco. If you need help quitting, ask your health care provider.  Return to your normal activities as told by your health  care provider. Ask your health care provider what activities are safe for you. General instructions  Take over-the-counter and prescription medicines only as told by your health care provider.  Drink enough fluid to keep your urine pale yellow.  Keep all follow-up visits as told by your health care provider. This is important. How is this prevented?  There is no vaccine to help prevent COVID-19 infection. However, there are steps you can take to protect yourself and others from this virus. To protect yourself:   Do not travel to areas where COVID-19 is a risk. The areas where COVID-19 is reported change often. To identify high-risk areas and travel restrictions, check the CDC travel website:  FatFares.com.br  If you live in, or must travel to, an area where COVID-19 is a risk, take precautions to avoid infection. ? Stay away from people who are sick. ? Wash your hands often with soap and water for 20 seconds. If soap and water are not available, use an alcohol-based hand sanitizer. ? Avoid touching your mouth, face, eyes, or nose. ? Avoid going out in public, follow guidance from your state and local health authorities. ? If you must go out in public, wear a cloth face covering or face mask. Make sure your mask covers your nose and mouth. ? Avoid crowded indoor spaces. Stay at least 6 feet (2 meters) away from others. ? Disinfect objects and surfaces that are frequently touched every day. This may include:  Counters and tables.  Doorknobs and light switches.  Sinks and faucets.  Electronics, such as phones, remote controls, keyboards, computers, and tablets. To protect others: If you have symptoms of COVID-19, take steps to prevent the virus from spreading to others.  If you think you have a COVID-19 infection, contact your health care provider right away. Tell your health care team that you think you may have a COVID-19 infection.  Stay home. Leave your house only to seek medical care. Do not use public transport.  Do not travel while you are sick.  Wash your hands often with soap and water for 20 seconds. If soap and water are not available, use alcohol-based hand sanitizer.  Stay away from other members of your household. Let healthy household members care for children and pets, if possible. If you have to care for children or pets, wash your hands often and wear a mask. If possible, stay in your own room, separate from others. Use a different bathroom.  Make sure that all people in your household wash their hands well and often.  Cough or sneeze into a tissue or your sleeve or elbow. Do not cough or sneeze into your hand or into the air.  Wear a cloth  face covering or face mask. Make sure your mask covers your nose and mouth. Where to find more information  Centers for Disease Control and Prevention: PurpleGadgets.be  World Health Organization: https://www.castaneda.info/ Contact a health care provider if:  You live in or have traveled to an area where COVID-19 is a risk and you have symptoms of the infection.  You have had contact with someone who has COVID-19 and you have symptoms of the infection. Get help right away if:  You have trouble breathing.  You have pain or pressure in your chest.  You have confusion.  You have bluish lips and fingernails.  You have difficulty waking from sleep.  You have symptoms that get worse. These symptoms may represent a serious problem that is an emergency. Do not  wait to see if the symptoms will go away. Get medical help right away. Call your local emergency services (911 in the [place] Do not drive yourself to the hospital. Let the emergency medical personnel know if you think you have COVID-19. Summary  COVID-19 is a respiratory infection that is caused by a virus. It is also known as coronavirus disease or novel coronavirus. It can cause serious infections, such as pneumonia, acute respiratory distress syndrome, acute respiratory failure, or sepsis.  The virus that causes COVID-19 is contagious. This means that it can spread from person to person through droplets from breathing, speaking, singing, coughing, or sneezing.  You are more likely to develop a serious illness if you are 6 years of age or older, have a weak immune system, live in a nursing home, or have chronic disease.  There is no medicine to treat COVID-19. Your health care provider will talk with you about ways to treat your symptoms.  Take steps to protect yourself and others from infection. Wash your hands often and disinfect objects and surfaces that are frequently touched every day.  Stay away from people who are sick and wear a mask if you are sick. This information is not intended to replace advice given to you by your health care provider. Make sure you discuss any questions you have with your health care provider. Document Revised: 01/18/2019 Document Reviewed: 04/26/2018 Elsevier Patient Education  [place].

## 2019-07-03 ENCOUNTER — Ambulatory Visit (INDEPENDENT_AMBULATORY_CARE_PROVIDER_SITE_OTHER): Payer: Self-pay | Admitting: Family Medicine

## 2019-07-03 ENCOUNTER — Other Ambulatory Visit: Payer: Self-pay

## 2019-07-03 VITALS — BP 120/78

## 2019-07-03 DIAGNOSIS — I10 Essential (primary) hypertension: Secondary | ICD-10-CM

## 2019-07-03 MED ORDER — ENALAPRIL MALEATE 20 MG PO TABS: ORAL_TABLET | ORAL | 1 refills | Status: DC

## 2019-07-03 MED ORDER — HYDROCHLOROTHIAZIDE 25 MG PO TABS: ORAL_TABLET | ORAL | 1 refills | Status: DC

## 2019-07-03 NOTE — Progress Notes (Signed)
   Subjective:  Audio only  Patient ID: Vincent Hahn, male    DOB: Apr 10, 1975, 44 y.o.   MRN: [id number] Pt has not checked bp but was checked at an appt last Friday and he can't remember what it was but states it was good. Hypertension This is a chronic problem. Treatments tried: enalapril, hctz. There are no compliance problems.    Virtual Visit via Telephone Note  I connected with Vincent Hahn on 07/03/19 at  9:30 AM EDT by telephone and verified that I am speaking with the correct person using two identifiers.  Location: Patient: home Provider: office   I discussed the limitations, risks, security and privacy concerns of performing an evaluation and management service by telephone and the availability of in person appointments. I also discussed with the patient that there may be a patient responsible charge related to this service. The patient expressed understanding and agreed to proceed.   History of Present Illness:    Observations/Objective:   Assessment and Plan:   Follow Up Instructions:    I discussed the assessment and treatment plan with the patient. The patient was provided an opportunity to ask questions and all were answered. The patient agreed with the plan and demonstrated an understanding of the instructions.   The patient was advised to call back or seek an in-person evaluation if the symptoms worsen or if the condition fails to improve as anticipated.  I provided 20 minutes of non-face-to-face time during this encounter.  Patient recently was diagnosed with COVID-19.  Now feeling much better.  First started symptoms 12 days ago.  Had a positive test.  Received a monoclonal antibody infusion     Review of Systems No headache, no major weight loss or weight gain, no chest pain no back pain abdominal pain no change in bowel habits complete ROS otherwise negative     Objective:   Physical Exam  Virtual      Assessment & Plan:  Impression 1  hypertension.  Apparent good control discussed maintain same meds diet exercise discussed and encouraged  2.  Morbid obesity.  I am very concerned about this patient going forward.  He is potentially open to bariatric interventions down the road whenever he gets insurance  3.  Recent Covid infection.  Now can return out of quarantine  Follow-up in 6 months

## 2019-08-16 ENCOUNTER — Other Ambulatory Visit: Payer: Self-pay | Admitting: Family Medicine

## 2019-09-07 ENCOUNTER — Other Ambulatory Visit: Payer: Self-pay | Admitting: Family Medicine

## 2019-09-09 NOTE — Telephone Encounter (Signed)
Needs appt for f/u in 3 months with labs 1 wk prior. Thx. Dr. Lovena Le

## 2019-09-09 NOTE — Telephone Encounter (Signed)
Htn check up march 2021

## 2019-09-09 NOTE — Telephone Encounter (Signed)
Called pt and scheduled for 3 months. He has active orders in epic he states he will do one week before appt in 3 months.

## 2019-10-08 ENCOUNTER — Telehealth: Payer: Self-pay | Admitting: Family Medicine

## 2019-10-08 MED ORDER — PANTOPRAZOLE SODIUM 40 MG PO TBEC: 40.0000 mg | DELAYED_RELEASE_TABLET | Freq: Every day | ORAL | 2 refills | Status: DC

## 2019-10-08 NOTE — Telephone Encounter (Signed)
Prescription sent electronically to pharmacy. Patient notified. 

## 2019-10-08 NOTE — Telephone Encounter (Signed)
pantoprazole 40 mg 1 daily, #30, 2 refills, follow-up with Dr. Lovena Le this fall

## 2019-10-08 NOTE — Telephone Encounter (Signed)
Pt got Covid in March and acid reflex got messed up he took over the counter prevacid, and zantac and nothing will clear up. Pt took a RX medication of a family member Pantoprazole 40 mg that cleared it up and he is wanting to know if he can get on RX for the acid reflex.   PT call back [phone]

## 2019-11-19 ENCOUNTER — Telehealth: Payer: Self-pay | Admitting: Family Medicine

## 2019-11-19 MED ORDER — PANTOPRAZOLE SODIUM 40 MG PO TBEC: 40.0000 mg | DELAYED_RELEASE_TABLET | Freq: Every day | ORAL | 0 refills | Status: DC

## 2019-11-19 NOTE — Telephone Encounter (Signed)
Patient is requesting refill on pantoprazole 40 mg for bad reflux . He has appointment for 12/10/19 for his followup

## 2019-12-07 ENCOUNTER — Other Ambulatory Visit: Payer: Self-pay | Admitting: Family Medicine

## 2019-12-10 ENCOUNTER — Ambulatory Visit: Payer: Self-pay | Admitting: Family Medicine

## 2019-12-19 ENCOUNTER — Ambulatory Visit (INDEPENDENT_AMBULATORY_CARE_PROVIDER_SITE_OTHER): Payer: Self-pay | Admitting: Family Medicine

## 2019-12-19 ENCOUNTER — Encounter: Payer: Self-pay | Admitting: Family Medicine

## 2019-12-19 ENCOUNTER — Other Ambulatory Visit: Payer: Self-pay

## 2019-12-19 VITALS — BP 136/84 | HR 73 | Temp 97.5°F | Ht 75.0 in | Wt >= 6400 oz

## 2019-12-19 DIAGNOSIS — K219 Gastro-esophageal reflux disease without esophagitis: Secondary | ICD-10-CM | POA: Insufficient documentation

## 2019-12-19 DIAGNOSIS — I10 Essential (primary) hypertension: Secondary | ICD-10-CM

## 2019-12-19 MED ORDER — SUCRALFATE 1 G PO TABS: 1.0000 g | ORAL_TABLET | Freq: Three times a day (TID) | ORAL | 0 refills | Status: DC

## 2019-12-19 MED ORDER — PANTOPRAZOLE SODIUM 40 MG PO TBEC: 40.0000 mg | DELAYED_RELEASE_TABLET | Freq: Two times a day (BID) | ORAL | 1 refills | Status: DC

## 2019-12-19 NOTE — Progress Notes (Signed)
Patient ID: Vincent Hahn, male    DOB: May 24, 1975, 44 y.o.   MRN: [id number]   Chief Complaint  Patient presents with  . Hypertension    discuss recent labs- discuss weight   Subjective:    HPI  Seen for f/u HTN.  Had covid in spring and having some reflux recently. covid was mild case.  Pt had plasma infusion.  GERD- Went to protonix prescription strength, in past year.  Pt on his own increase his protonix from 30m qd to 470mbid. Pt stating used to be on this dose per his GI doctor.  And taking 4 zantac per day.  Still feeling this isn't controlling his symptoms. Had gall bladder removed in 2004. And still not controlled.  Feels that it doesn't matter what he eats it's refluxing.  At times when working he can go to "burp" and regurg up food and vomits it out.  Has chest pain with it and feels better after he vomits up the food. Had EGD in past.  No blood or black stool.  Not taking a lot of nsaids. Taking generic tylenol. occ taking advil 40035mnce occ for pain.  Pt concerned about his increase in weight.  acknowleges hasn't been watching diet or exercising. Still drinking sodas and sweet tea.  Not watching portions, eating carbs,poor diet choices, and has a "sweet tooth."  HTN Pt compliant with BP meds.  No SEs Denies chest pain, sob, LE swelling, or blurry vision. Pt stating can't get bp cuff that fits his arm from pharmacy.    Medical History [place] no past medical history on file.   Outpatient Encounter Medications as of 12/19/2019  Medication Sig  . enalapril (VASOTEC) 20 MG tablet TAKE 1 TABLET(20 MG) BY MOUTH TWICE DAILY  . hydrochlorothiazide (HYDRODIURIL) 25 MG tablet TAKE 1/2 TABLET(12.5 MG) BY MOUTH DAILY  . pantoprazole (PROTONIX) 40 MG tablet Take 1 tablet (40 mg total) by mouth 2 (two) times daily.  . sucralfate (CARAFATE) 1 g tablet Take 1 tablet (1 g total) by mouth 4 (four) times daily -  with meals and at bedtime.  . [DISCONTINUED]  lansoprazole (PREVACID) 30 MG capsule Take 30 mg by mouth daily.  . [DISCONTINUED] pantoprazole (PROTONIX) 40 MG tablet Take 1 tablet (40 mg total) by mouth daily.   No facility-administered encounter medications on file as of 12/19/2019.     Review of Systems  Constitutional: Negative for chills and fever.  HENT: Negative for congestion, rhinorrhea and sore throat.   Respiratory: Negative for cough, shortness of breath and wheezing.   Cardiovascular: Negative for chest pain and leg swelling.  Gastrointestinal: Negative for abdominal pain, diarrhea, nausea and vomiting.       +acid reflux/regurgitation.  Genitourinary: Negative for dysuria and frequency.  Skin: Negative for rash.  Neurological: Negative for dizziness, weakness and headaches.     Vitals BP 136/84   Pulse 73   Temp (!) 97.5 F (36.4 C) (Oral)   Ht 6' 3" (1.905 m)   Wt (!) 410 lb (186 kg)   SpO2 96%   BMI 51.25 kg/m   Objective:   Physical Exam Vitals and nursing note reviewed.  Constitutional:      General: He is not in acute distress.    Appearance: Normal appearance. He is obese. He is not ill-appearing.  HENT:     Head: Normocephalic.     Nose: Nose normal. No congestion.     Mouth/Throat:     Mouth:  Mucous membranes are moist.     Pharynx: No oropharyngeal exudate.  Eyes:     Extraocular Movements: Extraocular movements intact.     Conjunctiva/sclera: Conjunctivae normal.     Pupils: Pupils are equal, round, and reactive to light.  Cardiovascular:     Rate and Rhythm: Normal rate and regular rhythm.     Pulses: Normal pulses.     Heart sounds: Normal heart sounds. No murmur heard.   Pulmonary:     Effort: Pulmonary effort is normal.     Breath sounds: Normal breath sounds. No wheezing, rhonchi or rales.  Abdominal:     General: Bowel sounds are normal. There is no distension.     Palpations: Abdomen is soft. There is no mass.     Tenderness: There is no abdominal tenderness. There is no  guarding or rebound.     Hernia: No hernia is present.  Musculoskeletal:        General: Normal range of motion.     Right lower leg: No edema.     Left lower leg: No edema.  Skin:    General: Skin is warm and dry.     Findings: No rash.  Neurological:     General: No focal deficit present.     Mental Status: He is alert and oriented to person, place, and time.     Cranial Nerves: No cranial nerve deficit.  Psychiatric:        Mood and Affect: Mood normal.        Behavior: Behavior normal.        Thought Content: Thought content normal.        Judgment: Judgment normal.      Assessment and Plan   1. Gastroesophageal reflux disease, unspecified whether esophagitis present - CBC - Ambulatory referral to Gastroenterology - sucralfate (CARAFATE) 1 g tablet; Take 1 tablet (1 g total) by mouth 4 (four) times daily -  with meals and at bedtime.  Dispense: 45 tablet; Refill: 0 - pantoprazole (PROTONIX) 40 MG tablet; Take 1 tablet (40 mg total) by mouth 2 (two) times daily.  Dispense: 60 tablet; Refill: 1  2. Essential hypertension - CBC - CMP14+EGFR - Lipid panel  3. Morbid obesity ([place]) - Hemoglobin A1c    htn- suboptimal, will give trial of dash diet and avoiding adding salt to foods. Cont meds. Ordered labs. Pt to labs next week.  GERD- increased the protonix to 52m bid and advising pt needing to f/u with GI again, since things are worsening after being on multiple medications for this. Also gave carafate.  Morbid Obesity-  Discussed working on his diet and decreasing salt and carbs. Trying to cut out more salt. Wanting to try to walk some in mornings. Pt hasn't had labs since 2019. Pt has enough bp meds till he gets lab work.  Pt going to get labs next week.  F/u 340mor prn.

## 2019-12-19 NOTE — Patient Instructions (Signed)
DASH Eating Plan DASH stands for "Dietary Approaches to Stop Hypertension." The DASH eating plan is a healthy eating plan that has been shown to reduce high blood pressure (hypertension). It may also reduce your risk for type 2 diabetes, heart disease, and stroke. The DASH eating plan may also help with weight loss. What are tips for following this plan?  General guidelines  Avoid eating more than 2,300 mg (milligrams) of salt (sodium) a day. If you have hypertension, you may need to reduce your sodium intake to 1,500 mg a day.  Limit alcohol intake to no more than 1 drink a day for nonpregnant women and 2 drinks a day for men. One drink equals 12 oz of beer, 5 oz of wine, or 1 oz of hard liquor.  Work with your health care provider to maintain a healthy body weight or to lose weight. Ask what an ideal weight is for you.  Get at least 30 minutes of exercise that causes your heart to beat faster (aerobic exercise) most days of the week. Activities may include walking, swimming, or biking.  Work with your health care provider or diet and nutrition specialist (dietitian) to adjust your eating plan to your individual calorie needs. Reading food labels   Check food labels for the amount of sodium per serving. Choose foods with less than 5 percent of the Daily Value of sodium. Generally, foods with less than 300 mg of sodium per serving fit into this eating plan.  To find whole grains, look for the word "whole" as the first word in the ingredient list. Shopping  Buy products labeled as "low-sodium" or "no salt added."  Buy fresh foods. Avoid canned foods and premade or frozen meals. Cooking  Avoid adding salt when cooking. Use salt-free seasonings or herbs instead of table salt or sea salt. Check with your health care provider or pharmacist before using salt substitutes.  Do not fry foods. Cook foods using healthy methods such as baking, boiling, grilling, and broiling instead.  Cook with  heart-healthy oils, such as olive, canola, soybean, or sunflower oil. Meal planning  Eat a balanced diet that includes: ? 5 or more servings of fruits and vegetables each day. At each meal, try to fill half of your plate with fruits and vegetables. ? Up to 6-8 servings of whole grains each day. ? Less than 6 oz of lean meat, poultry, or fish each day. A 3-oz serving of meat is about the same size as a deck of cards. One egg equals 1 oz. ? 2 servings of low-fat dairy each day. ? A serving of nuts, seeds, or beans 5 times each week. ? Heart-healthy fats. Healthy fats called Omega-3 fatty acids are found in foods such as flaxseeds and coldwater fish, like sardines, salmon, and mackerel.  Limit how much you eat of the following: ? Canned or prepackaged foods. ? Food that is high in trans fat, such as fried foods. ? Food that is high in saturated fat, such as fatty meat. ? Sweets, desserts, sugary drinks, and other foods with added sugar. ? Full-fat dairy products.  Do not salt foods before eating.  Try to eat at least 2 vegetarian meals each week.  Eat more home-cooked food and less restaurant, buffet, and fast food.  When eating at a restaurant, ask that your food be prepared with less salt or no salt, if possible. What foods are recommended? The items listed may not be a complete list. Talk with your dietitian about   what dietary choices are best for you. Grains Whole-grain or whole-wheat bread. Whole-grain or whole-wheat pasta. Brown rice. Oatmeal. Quinoa. Bulgur. Whole-grain and low-sodium cereals. Pita bread. Low-fat, low-sodium crackers. Whole-wheat flour tortillas. Vegetables Fresh or frozen vegetables (raw, steamed, roasted, or grilled). Low-sodium or reduced-sodium tomato and vegetable juice. Low-sodium or reduced-sodium tomato sauce and tomato paste. Low-sodium or reduced-sodium canned vegetables. Fruits All fresh, dried, or frozen fruit. Canned fruit in natural juice (without  added sugar). Meat and other protein foods Skinless chicken or turkey. Ground chicken or turkey. Pork with fat trimmed off. Fish and seafood. Egg whites. Dried beans, peas, or lentils. Unsalted nuts, nut butters, and seeds. Unsalted canned beans. Lean cuts of beef with fat trimmed off. Low-sodium, lean deli meat. Dairy Low-fat (1%) or fat-free (skim) milk. Fat-free, low-fat, or reduced-fat cheeses. Nonfat, low-sodium ricotta or cottage cheese. Low-fat or nonfat yogurt. Low-fat, low-sodium cheese. Fats and oils Soft margarine without trans fats. Vegetable oil. Low-fat, reduced-fat, or light mayonnaise and salad dressings (reduced-sodium). Canola, safflower, olive, soybean, and sunflower oils. Avocado. Seasoning and other foods Herbs. Spices. Seasoning mixes without salt. Unsalted popcorn and pretzels. Fat-free sweets. What foods are not recommended? The items listed may not be a complete list. Talk with your dietitian about what dietary choices are best for you. Grains Baked goods made with fat, such as croissants, muffins, or some breads. Dry pasta or rice meal packs. Vegetables Creamed or fried vegetables. Vegetables in a cheese sauce. Regular canned vegetables (not low-sodium or reduced-sodium). Regular canned tomato sauce and paste (not low-sodium or reduced-sodium). Regular tomato and vegetable juice (not low-sodium or reduced-sodium). Pickles. Olives. Fruits Canned fruit in a light or heavy syrup. Fried fruit. Fruit in cream or butter sauce. Meat and other protein foods Fatty cuts of meat. Ribs. Fried meat. Bacon. Sausage. Bologna and other processed lunch meats. Salami. Fatback. Hotdogs. Bratwurst. Salted nuts and seeds. Canned beans with added salt. Canned or smoked fish. Whole eggs or egg yolks. Chicken or turkey with skin. Dairy Whole or 2% milk, cream, and half-and-half. Whole or full-fat cream cheese. Whole-fat or sweetened yogurt. Full-fat cheese. Nondairy creamers. Whipped toppings.  Processed cheese and cheese spreads. Fats and oils Butter. Stick margarine. Lard. Shortening. Ghee. Bacon fat. Tropical oils, such as coconut, palm kernel, or palm oil. Seasoning and other foods Salted popcorn and pretzels. Onion salt, garlic salt, seasoned salt, table salt, and sea salt. Worcestershire sauce. Tartar sauce. Barbecue sauce. Teriyaki sauce. Soy sauce, including reduced-sodium. Steak sauce. Canned and packaged gravies. Fish sauce. Oyster sauce. Cocktail sauce. Horseradish that you find on the shelf. Ketchup. Mustard. Meat flavorings and tenderizers. Bouillon cubes. Hot sauce and Tabasco sauce. Premade or packaged marinades. Premade or packaged taco seasonings. Relishes. Regular salad dressings. Where to find more information:  National Heart, Lung, and Blood Institute: www.nhlbi.nih.gov  American Heart Association: www.heart.org Summary  The DASH eating plan is a healthy eating plan that has been shown to reduce high blood pressure (hypertension). It may also reduce your risk for type 2 diabetes, heart disease, and stroke.  With the DASH eating plan, you should limit salt (sodium) intake to 2,300 mg a day. If you have hypertension, you may need to reduce your sodium intake to 1,500 mg a day.  When on the DASH eating plan, aim to eat more fresh fruits and vegetables, whole grains, lean proteins, low-fat dairy, and heart-healthy fats.  Work with your health care provider or diet and nutrition specialist (dietitian) to adjust your eating plan to your   individual calorie needs. This information is not intended to replace advice given to you by your health care provider. Make sure you discuss any questions you have with your health care provider. Document Revised: 03/03/2017 Document Reviewed: 03/14/2016 Elsevier Patient Education  2020 Elsevier Inc.  

## 2019-12-24 ENCOUNTER — Other Ambulatory Visit: Payer: Self-pay | Admitting: *Deleted

## 2019-12-24 DIAGNOSIS — E781 Pure hyperglyceridemia: Secondary | ICD-10-CM

## 2019-12-24 DIAGNOSIS — R7303 Prediabetes: Secondary | ICD-10-CM

## 2019-12-24 LAB — CMP14+EGFR
ALT: 32 IU/L (ref 0–44)
AST: 23 IU/L (ref 0–40)
Albumin/Globulin Ratio: 1.4 (ref 1.2–2.2)
Albumin: 4.1 g/dL (ref 4.0–5.0)
Alkaline Phosphatase: 112 IU/L (ref 44–121)
BUN/Creatinine Ratio: 15 (ref 9–20)
BUN: 14 mg/dL (ref 6–24)
Bilirubin Total: 0.3 mg/dL (ref 0.0–1.2)
CO2: 22 mmol/L (ref 20–29)
Calcium: 8.8 mg/dL (ref 8.7–10.2)
Chloride: 98 mmol/L (ref 96–106)
Creatinine, Ser: 0.94 mg/dL (ref 0.76–1.27)
GFR calc Af Amer: 114 mL/min/{1.73_m2} (ref >59)
GFR calc non Af Amer: 98 mL/min/{1.73_m2} (ref >59)
Globulin, Total: 3 g/dL (ref 1.5–4.5)
Glucose: 87 mg/dL (ref 65–99)
Potassium: 4.7 mmol/L (ref 3.5–5.2)
Sodium: 140 mmol/L (ref 134–144)
Total Protein: 7.1 g/dL (ref 6.0–8.5)

## 2019-12-24 LAB — HEMOGLOBIN A1C
Est. average glucose Bld gHb Est-mCnc: 117 mg/dL
Hgb A1c MFr Bld: 5.7 % — ABNORMAL HIGH (ref 4.8–5.6)

## 2019-12-24 LAB — CBC
Hematocrit: 45.2 % (ref 37.5–51.0)
Hemoglobin: 14.8 g/dL (ref 13.0–17.7)
MCH: 26.9 pg (ref 26.6–33.0)
MCHC: 32.7 g/dL (ref 31.5–35.7)
MCV: 82 fL (ref 79–97)
Platelets: 261 10*3/uL (ref 150–450)
RBC: 5.51 x10E6/uL (ref 4.14–5.80)
RDW: 13.8 % (ref 11.6–15.4)
WBC: 7.6 10*3/uL (ref 3.4–10.8)

## 2019-12-24 LAB — LIPID PANEL
Chol/HDL Ratio: 5.6 ratio — ABNORMAL HIGH (ref 0.0–5.0)
Cholesterol, Total: 197 mg/dL (ref 100–199)
HDL: 35 mg/dL — ABNORMAL LOW (ref >39)
LDL Chol Calc (NIH): 122 mg/dL — ABNORMAL HIGH (ref 0–99)
Triglycerides: 224 mg/dL — ABNORMAL HIGH (ref 0–149)
VLDL Cholesterol Cal: 40 mg/dL (ref 5–40)

## 2020-01-09 ENCOUNTER — Encounter (HOSPITAL_COMMUNITY): Payer: Self-pay | Admitting: Emergency Medicine

## 2020-01-09 ENCOUNTER — Emergency Department (HOSPITAL_COMMUNITY)
Admission: EM | Admit: 2020-01-09 | Discharge: 2020-01-09 | Disposition: A | Discharge status: Home / Self Care | Payer: Self-pay | Attending: Emergency Medicine | Admitting: Emergency Medicine

## 2020-01-09 ENCOUNTER — Other Ambulatory Visit: Payer: Self-pay

## 2020-01-09 ENCOUNTER — Emergency Department (HOSPITAL_BASED_OUTPATIENT_CLINIC_OR_DEPARTMENT_OTHER): Payer: Self-pay

## 2020-01-09 ENCOUNTER — Emergency Department (HOSPITAL_COMMUNITY): Payer: Self-pay

## 2020-01-09 ENCOUNTER — Telehealth: Payer: Self-pay

## 2020-01-09 DIAGNOSIS — R0789 Other chest pain: Secondary | ICD-10-CM

## 2020-01-09 DIAGNOSIS — I10 Essential (primary) hypertension: Secondary | ICD-10-CM | POA: Insufficient documentation

## 2020-01-09 DIAGNOSIS — R600 Localized edema: Secondary | ICD-10-CM | POA: Insufficient documentation

## 2020-01-09 DIAGNOSIS — R079 Chest pain, unspecified: Secondary | ICD-10-CM

## 2020-01-09 DIAGNOSIS — Z79899 Other long term (current) drug therapy: Secondary | ICD-10-CM | POA: Insufficient documentation

## 2020-01-09 DIAGNOSIS — I2510 Atherosclerotic heart disease of native coronary artery without angina pectoris: Secondary | ICD-10-CM | POA: Insufficient documentation

## 2020-01-09 DIAGNOSIS — J45909 Unspecified asthma, uncomplicated: Secondary | ICD-10-CM | POA: Insufficient documentation

## 2020-01-09 HISTORY — DX: Essential (primary) hypertension: I10

## 2020-01-09 HISTORY — DX: Gastro-esophageal reflux disease without esophagitis: K21.9

## 2020-01-09 LAB — BASIC METABOLIC PANEL
Anion gap: 9 (ref 5–15)
BUN: 19 mg/dL (ref 6–20)
CO2: 27 mmol/L (ref 22–32)
Calcium: 8.8 mg/dL — ABNORMAL LOW (ref 8.9–10.3)
Chloride: 100 mmol/L (ref 98–111)
Creatinine, Ser: 0.83 mg/dL (ref 0.61–1.24)
GFR calc non Af Amer: >60 mL/min (ref >60)
Glucose, Bld: 104 mg/dL — ABNORMAL HIGH (ref 70–99)
Potassium: 4.1 mmol/L (ref 3.5–5.1)
Sodium: 136 mmol/L (ref 135–145)

## 2020-01-09 LAB — ECHOCARDIOGRAM COMPLETE
AR max vel: 2.95 cm2
AV Area VTI: 3.11 cm2
AV Area mean vel: 2.76 cm2
AV Mean grad: 5 mmHg
AV Peak grad: 9.2 mmHg
Ao pk vel: 1.52 m/s
Area-P 1/2: 2.38 cm2
Height: 75 in
S' Lateral: 3.17 cm
Weight: 6240 oz

## 2020-01-09 LAB — CBC
HCT: 43.7 % (ref 39.0–52.0)
Hemoglobin: 14.1 g/dL (ref 13.0–17.0)
MCH: 27.2 pg (ref 26.0–34.0)
MCHC: 32.3 g/dL (ref 30.0–36.0)
MCV: 84.2 fL (ref 80.0–100.0)
Platelets: 242 10*3/uL (ref 150–400)
RBC: 5.19 MIL/uL (ref 4.22–5.81)
RDW: 14.1 % (ref 11.5–15.5)
WBC: 9.1 10*3/uL (ref 4.0–10.5)
nRBC: 0 % (ref 0.0–0.2)

## 2020-01-09 LAB — TROPONIN I (HIGH SENSITIVITY)
Troponin I (High Sensitivity): 2 ng/L (ref <18)
Troponin I (High Sensitivity): 2 ng/L (ref <18)

## 2020-01-09 NOTE — ED Notes (Signed)
ED Provider at bedside. 

## 2020-01-09 NOTE — ED Triage Notes (Signed)
Pt c/o cp that started 1 week ago. Pt started having neck pain, jaw pain and back of head pain that started yesterday.

## 2020-01-09 NOTE — Progress Notes (Signed)
*  PRELIMINARY RESULTS* Echocardiogram 2D Echocardiogram has been performed.  Vincent Hahn 01/09/2020, 4:29 PM

## 2020-01-09 NOTE — Discharge Instructions (Addendum)
Your labs, imaging including your echocardiogram are reassuring today but it is recommended you have an outpatient stress test as recommended by Dr. Harl Bowie.  His office should call you to arrange this study.  In the interim, return here if you develop any new or worsened symptoms.

## 2020-01-09 NOTE — Telephone Encounter (Signed)
Erma Heritage, [name]  [facility], Marikay Alar, LPN; Bernita Raisin, RN Cc: Dorothey Baseman R Good evening,   This patient needs a 2-day protocol Lexiscan Myoview for chest pain per Dr. Harl Bowie. Will need follow-up with APP after stress test. Being discharged from the Emergency Dept today.   Thanks,  [place]        Order placed for 2 day lexiscan

## 2020-01-09 NOTE — Consult Note (Addendum)
Cardiology Consultation:   Patient ID: Vincent Hahn MRN: [id number]; DOB: 09/02/1975  Admit date: 01/09/2020 Date of Consult: 01/09/2020  Primary Care Provider: Erven Colla, DO [facility] Cardiologist: [place] Electrophysiologist:  None    Patient Profile:   Vincent Hahn is a 44 y.o. male with a hx of HTN, GERD who is being seen today for the evaluation of chest pain at the request of Dr Roderic Palau.  History of Present Illness:   Vincent Hahn 44 yo male history of HTN, severe GERD presents with chest pain.  He has a history of sever GERD and reports a long history of chronic burning midchest pains. 5-6/10 with some nausea, can have regurgitation and spit up food with pain. On chronic antacids. He reports this pain is chronic and unchanged  What is worrisome to him is more recently a new type of chest pain. Can feel in either left or right shoulder, can shoot up into back of neck and bilateral jaw. Can be worst with positioning or turning neck, can last several hours at a time. Can be worst with exertion. Does report using a pole saw last week with heavy upper body exertion.     K 4.1 Cr 0.83 BUN 19 WBC 9.1 Hgb 14.1 Plt 242 hstrop 2-->2 EKG SR, no ischemic changes CXR no acute process     Son of Nova Evett, a former patient of mine very nice guy that passed in 2019  Past Medical History:  Diagnosis Date  . GERD (gastroesophageal reflux disease)   . Hypertension     Past Surgical History:  Procedure Laterality Date  . CHOLECYSTECTOMY       Inpatient Medications: Scheduled Meds:  Continuous Infusions:  PRN Meds:   Allergies:   No Known Allergies  Social History:   Social History   Socioeconomic History  . Marital status: Married    Spouse name: Not on file  . Number of children: Not on file  . Years of education: Not on file  . Highest education level: Not on file  Occupational History  . Not on file  Tobacco Use  . Smoking status:  Never Smoker  . Smokeless tobacco: Never Used  Substance and Sexual Activity  . Alcohol use: No  . Drug use: No  . Sexual activity: Not on file  Other Topics Concern  . Not on file  Social History Narrative  . Not on file   Social Determinants of Health   Financial Resource Strain:   . Difficulty of Paying Living Expenses: Not on file  Food Insecurity:   . Worried About Charity fundraiser in the Last Year: Not on file  . Ran Out of Food in the Last Year: Not on file  Transportation Needs:   . Lack of Transportation (Medical): Not on file  . Lack of Transportation (Non-Medical): Not on file  Physical Activity:   . Days of Exercise per Week: Not on file  . Minutes of Exercise per Session: Not on file  Stress:   . Feeling of Stress : Not on file  Social Connections:   . Frequency of Communication with Friends and Family: Not on file  . Frequency of Social Gatherings with Friends and Family: Not on file  . Attends Religious Services: Not on file  . Active Member of Clubs or Organizations: Not on file  . Attends Archivist Meetings: Not on file  . Marital Status: Not on file  Intimate Partner Violence:   .  Fear of Current or Ex-Partner: Not on file  . Emotionally Abused: Not on file  . Physically Abused: Not on file  . Sexually Abused: Not on file    Family History:   *History reviewed. No pertinent family history.   ROS:  Please see the history of present illness.  All other ROS reviewed and negative.     Physical Exam/Data:   Vitals:   01/09/20 1030 01/09/20 1217 01/09/20 1300 01/09/20 1330  BP: 129/68 (!) 142/80 118/69 121/60  Pulse: 83 86 78 72  Resp: (!) 23 (!) 21 14 17   Temp:      TempSrc:      SpO2: 95% 96% 94% 95%  Weight:      Height:       No intake or output data in the 24 hours ending 01/09/20 1357 Last 3 Weights 01/09/2020 12/19/2019 06/04/2018  Weight (lbs) 390 lb 410 lb 424 lb  Weight (kg) 176.903 kg 185.975 kg 192.325 kg     Body mass  index is 48.75 kg/m.  General:  Well nourished, well developed, in no acute distress HEENT: normal Lymph: no adenopathy Neck: no JVD Endocrine:  No thryomegaly Vascular: No carotid bruits; FA pulses 2+ bilaterally without bruits  Cardiac:  normal S1, S2; RRR; no murmur  Lungs:  clear to auscultation bilaterally, no wheezing, rhonchi or rales  Abd: soft, nontender, no hepatomegaly  Ext: no edema Musculoskeletal:  No deformities, BUE and BLE strength normal and equal Skin: warm and dry  Neuro:  CNs 2-12 intact, no focal abnormalities noted Psych:  Normal affect     Laboratory Data:  High Sensitivity Troponin:   Recent Labs  Lab 01/09/20 0938 01/09/20 1119  TROPONINIHS 2 2     Chemistry Recent Labs  Lab 01/09/20 0938  NA 136  K 4.1  CL 100  CO2 27  GLUCOSE 104*  BUN 19  CREATININE 0.83  CALCIUM 8.8*  GFRNONAA >60  ANIONGAP 9    No results for input(s): PROT, ALBUMIN, AST, ALT, ALKPHOS, BILITOT in the last 168 hours. Hematology Recent Labs  Lab 01/09/20 0938  WBC 9.1  RBC 5.19  HGB 14.1  HCT 43.7  MCV 84.2  MCH 27.2  MCHC 32.3  RDW 14.1  PLT 242   BNPNo results for input(s): BNP, PROBNP in the last 168 hours.  DDimer No results for input(s): DDIMER in the last 168 hours.   Radiology/Studies:  DG Chest 2 View  Result Date: 01/09/2020 CLINICAL DATA:  Chest pain EXAM: CHEST - 2 VIEW COMPARISON:  Aug 18, 2005 FINDINGS: There is a degree of elevation of the right hemidiaphragm, stable. No edema or airspace opacity. Heart size and pulmonary vascularity are normal. No adenopathy. No bone lesions. IMPRESSION: Lungs clear.  Cardiac silhouette within normal limits. Electronically Signed   By: Lowella Grip III M.D.   On: 01/09/2020 10:50     Assessment and Plan:   1. Chest pain - primarily atypical chest pains as described above - hstrop negative with out any delta, EKG is normal - only typical component is somewhat of an exertional component.  - we  will obtain an echo in the ER, if normal would d/c from ER and plan for outpatient stress test. He has chronic knee pains, would plan for 2 day lexiscan.      Addendum Normal echo, ok for discharge from ER. We will plan on outpatient 2 day lexiscan (cannot run due to chronic knee pains) and outpatient f/u appt  For questions or updates, please contact [place] Please consult [email or web address] for contact info under    Signed, Carlyle Dolly, MD  01/09/2020 1:57 PM

## 2020-01-09 NOTE — Clinical Social Work Note (Signed)
Transition of Care [facility]) - Emergency Department Mini Assessment  Patient Details  Name: Vincent Hahn MRN: [id number] Date of Birth: May 13, 1975  [facility] CM/SW Contact:    Sherie Don, LCSW Phone Number: 01/09/2020, 11:01 AM  Clinical Narrative: Patient is a 44 year old male who presented to the ED for chest, neck, and jaw pain x1 week. Per chart review, patient does not have insurance. CSW spoke with patient to discuss referral to financial counselor. 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 TOC signing off.  ED Mini Assessment: What brought you to the Emergency Department? : Chest, neck, and jaw pain for 1 week Barriers to Discharge: ED Barriers Resolved Barrier interventions: Patient will set up payment plan for ED visit Means of departure: Car Interventions which prevented an admission or readmission: Other (must enter comment) (Discussed financial counselor referral)  Admission diagnosis:  CHEST/NECK/JAW PN X 1 WK Patient Active Problem List   Diagnosis Date Noted  . GERD (gastroesophageal reflux disease) 12/19/2019  . Morbid obesity ([place]) 09/07/2015  . Essential hypertension 09/07/2015  . Asthma with acute exacerbation 02/13/2013   PCP:  Erven Colla, DO Pharmacy:   [place], [place] - [address] [place] [address] Phone: [phone] Fax: [place], [place] [place] [address] Phone: [phone] Fax: [phone]

## 2020-01-09 NOTE — ED Provider Notes (Signed)
[facility] EMERGENCY DEPARTMENT Provider Note   CSN: [id number] Arrival date & time: 01/09/20  0254     History Chief Complaint  Patient presents with  . Chest Pain    Vincent Hahn is a 44 y.o. male with a history of GERD, hypertension and anxiety presenting for evaluation of a 1 week history of chest pain.  He describes intermittent pain in his midsternal to left chest described as a dull yet sometimes burning sensation with radiation into his left shoulder, has now progressed to radiation into his neck and jaw bilaterally.  His symptoms can occur with deep inspiration but becomes significantly worsened with ambulation and resolves after several minutes of rest.  He denies shortness of breath, fevers or chills.  He has a chronic cough which she associates with his severe GERD.  He is on multiple medications for this and is seeing a GI specialist next week.  He denies abdominal pain, no nausea or vomiting, no diaphoresis or palpitations.  He reports similar symptoms over 10 years ago prior to his cholecystectomy.  He states at that time he had an exercise treadmill test which was a normal study.  Risk factors for coronary disease include obesity, hypertension.  He is a non-smoker, no family history of CAD at an early age.  The history is provided by the patient.    HPI: A 44 year old patient with a history of hypertension and obesity presents for evaluation of chest pain. Initial onset of pain was more than 6 hours ago. The patient's chest pain is worse with exertion. The patient's chest pain is middle- or left-sided, is not well-localized, is not described as heaviness/pressure/tightness, is not sharp and does radiate to the arms/jaw/neck. The patient does not complain of nausea and denies diaphoresis. The patient has no history of stroke, has no history of peripheral artery disease, has not smoked in the past 90 days, denies any history of treated diabetes, has no relevant family history of  coronary artery disease (first degree relative at less than age 92) and has no history of hypercholesterolemia.   Past Medical History:  Diagnosis Date  . GERD (gastroesophageal reflux disease)   . Hypertension     Patient Active Problem List   Diagnosis Date Noted  . GERD (gastroesophageal reflux disease) 12/19/2019  . Morbid obesity ([place]) 09/07/2015  . Essential hypertension 09/07/2015  . Asthma with acute exacerbation 02/13/2013    Past Surgical History:  Procedure Laterality Date  . CHOLECYSTECTOMY         History reviewed. No pertinent family history.  Social History   Tobacco Use  . Smoking status: Never Smoker  . Smokeless tobacco: Never Used  Substance Use Topics  . Alcohol use: No  . Drug use: No    Home Medications Prior to Admission medications   Medication Sig Start Date End Date Taking? Authorizing Provider  aluminum-magnesium hydroxide-simethicone (MAALOX) 270-623-76 MG/5ML SUSP Take 30 mLs by mouth 4 (four) times daily -  before meals and at bedtime.   Yes [provider]  enalapril (VASOTEC) 20 MG tablet TAKE 1 TABLET(20 MG) BY MOUTH TWICE DAILY Patient taking differently: Take 20 mg by mouth 2 (two) times daily.  12/10/19  Yes Lovena Le, Malena M, DO  hydrochlorothiazide (HYDRODIURIL) 25 MG tablet TAKE 1/2 TABLET(12.5 MG) BY MOUTH DAILY Patient taking differently: Take 12.5 mg by mouth daily.  08/16/19  Yes Mikey Kirschner, MD  ibuprofen (ADVIL) 200 MG tablet Take 200 mg by mouth every 6 (six) hours  as needed for moderate pain.   Yes [provider]  Multiple Vitamin (MULTI VITAMIN DAILY PO) Take 1 tablet by mouth daily.   Yes [provider]  pantoprazole (PROTONIX) 40 MG tablet Take 1 tablet (40 mg total) by mouth 2 (two) times daily. 12/19/19  Yes Lovena Le, Malena M, DO  sucralfate (CARAFATE) 1 g tablet Take 1 tablet (1 g total) by mouth 4 (four) times daily -  with meals and at bedtime. 12/19/19  Yes Lovena Le, Malena M, DO     Allergies    Patient has no known allergies.  Review of Systems   Review of Systems  Constitutional: Negative for chills and fever.  HENT: Negative for congestion and sore throat.   Eyes: Negative.   Respiratory: Negative for chest tightness and shortness of breath.   Cardiovascular: Positive for chest pain. Negative for palpitations and leg swelling.  Gastrointestinal: Negative for abdominal pain, nausea and vomiting.  Genitourinary: Negative.   Musculoskeletal: Negative for arthralgias, joint swelling and neck pain.  Skin: Negative.  Negative for rash and wound.  Neurological: Negative for dizziness, weakness, light-headedness, numbness and headaches.  Psychiatric/Behavioral: Negative.   All other systems reviewed and are negative.   Physical Exam Updated Vital Signs BP 117/66   Pulse 81   Temp 99.1 F (37.3 C) (Oral)   Resp 18   Ht 6' 3"  (1.905 m)   Wt (!) 176.9 kg   SpO2 95%   BMI 48.75 kg/m   Physical Exam Vitals and nursing note reviewed.  Constitutional:      Appearance: He is well-developed. He is obese.  HENT:     Head: Normocephalic and atraumatic.  Eyes:     Conjunctiva/sclera: Conjunctivae normal.  Cardiovascular:     Rate and Rhythm: Normal rate and regular rhythm.     Heart sounds: Normal heart sounds. No murmur heard.   Pulmonary:     Effort: Pulmonary effort is normal.     Breath sounds: Normal breath sounds. No decreased breath sounds, wheezing or rhonchi.  Abdominal:     General: Bowel sounds are normal.     Palpations: Abdomen is soft.     Tenderness: There is no abdominal tenderness.  Musculoskeletal:        General: Normal range of motion.     Cervical back: Normal range of motion.     Right lower leg: No tenderness. Edema present.     Left lower leg: No tenderness. Edema present.     Comments: Trace nonpitting edema bilateral lower legs.  No calf pain or tenderness.  Skin:    General: Skin is warm and dry.  Neurological:      Mental Status: He is alert.     ED Results / Procedures / Treatments   Labs (all labs ordered are listed, but only abnormal results are displayed) Labs Reviewed  BASIC METABOLIC PANEL - Abnormal; Notable for the following components:      Result Value   Glucose, Bld 104 (*)    Calcium 8.8 (*)    All other components within normal limits  CBC  TROPONIN I (HIGH SENSITIVITY)  TROPONIN I (HIGH SENSITIVITY)    EKG EKG Interpretation  Date/Time:  Thursday January 09 2020 09:29:18 EDT Ventricular Rate:  86 PR Interval:    QRS Duration: 113 QT Interval:  374 QTC Calculation: 448 R Axis:   72 Text Interpretation: Sinus rhythm Incomplete right bundle branch block Confirmed by Milton Ferguson [phone]) on 01/09/2020 11:09:46 AM  Radiology DG Chest 2 View  Result Date: 01/09/2020 CLINICAL DATA:  Chest pain EXAM: CHEST - 2 VIEW COMPARISON:  Aug 18, 2005 FINDINGS: There is a degree of elevation of the right hemidiaphragm, stable. No edema or airspace opacity. Heart size and pulmonary vascularity are normal. No adenopathy. No bone lesions. IMPRESSION: Lungs clear.  Cardiac silhouette within normal limits. Electronically Signed   By: Lowella Grip III M.D.   On: 01/09/2020 10:50    Procedures Procedures (including critical care time)  Medications Ordered in ED Medications - No data to display  ED Course  I have reviewed the triage vital signs and the nursing notes.  Pertinent labs & imaging results that were available during my care of the patient were reviewed by me and considered in my medical decision making (see chart for details).    MDM Rules/Calculators/A&P HEAR Score: 3                        Pt with heart score 3, sx free at initial presentation.  At recheck 13:41 - reports pain in the left shoulder currently.  Pt ranged the shoulder/left arm which improved but not completely resolved his pain.  May be musculoskeletal component, but with radiation to neck and jaw,  concern for cardiac component or new unstable angina.    Call to Dr. Harl Bowie placed for assistance with f/u care. Discussed pt with Dr. Harl Bowie - will see in ED to help determine need for admission for cardiac rule out vs outpt f/u care.  Pt seen by Dr. Harl Bowie who has scheduled echo for now, if negative, will plan outpatient Lexiscan stress test.   Echo completed and ok.  DC home with f/u by Dr Harl Bowie.  His office will arrange outpt Lexiscan stress testing.  Final Clinical Impression(s) / ED Diagnoses Final diagnoses:  Chest pain, unspecified type    Rx / DC Orders ED Discharge Orders    None       Landis Martins 01/09/20 1653    Milton Ferguson, MD 01/10/20 [phone]

## 2020-01-10 ENCOUNTER — Ambulatory Visit (HOSPITAL_COMMUNITY): Payer: Self-pay

## 2020-01-16 ENCOUNTER — Ambulatory Visit (INDEPENDENT_AMBULATORY_CARE_PROVIDER_SITE_OTHER): Payer: PRIVATE HEALTH INSURANCE | Admitting: Gastroenterology

## 2020-01-16 ENCOUNTER — Encounter (INDEPENDENT_AMBULATORY_CARE_PROVIDER_SITE_OTHER): Payer: Self-pay | Admitting: Gastroenterology

## 2020-01-16 ENCOUNTER — Other Ambulatory Visit: Payer: Self-pay

## 2020-01-16 VITALS — BP 128/81 | HR 86 | Temp 98.6°F | Ht 75.0 in | Wt 396.2 lb

## 2020-01-16 DIAGNOSIS — K219 Gastro-esophageal reflux disease without esophagitis: Secondary | ICD-10-CM

## 2020-01-16 MED ORDER — PANTOPRAZOLE SODIUM 40 MG PO TBEC: 40.0000 mg | DELAYED_RELEASE_TABLET | Freq: Two times a day (BID) | ORAL | 2 refills | Status: DC

## 2020-01-16 NOTE — Patient Instructions (Signed)
Patient encouraged to lose weight as this may provide benefit for his specific complaint but also due to other comorbidities Continue panmtoprazole 40 mg q12h Explained presumed etiology of reflux symptoms. Instruction provided in the use of antireflux medication - patient should take medication in the morning 30-45 minutes before eating breakfast. Discussed avoidance of eating within 2 hours of lying down to sleep and benefit of blocks to elevate head of bed.

## 2020-01-16 NOTE — Progress Notes (Signed)
Vincent Hahn, M.D. Gastroenterology & Hepatology [facility] [address] [place], [place] [address] Primary Care Physician: Erven Colla, DO [address] [place] [address]  Referring MD: PCP  I will communicate my assessment and recommendations to the referring MD via EMR. Note: Occasional unusual wording and randomly placed punctuation marks may result from the use of speech recognition technology to transcribe this document"  Chief Complaint: GERD  History of Present Illness: Vincent Hahn is a 44 y.o. male with PMH HTN who presents for evaluation of GERD.  Patient reports that at least sin 2005 he presented with recurrent episodes of heartburn.  He states that at that time he was started on Prevacid twice daily by his PCP which led to an improvement in his symptoms.  He decided to make a change in his dietary habits and this led to decreasing the intake of Prevacid to once a day dosing.  He reported that he was feeling well for multiple years but eventually during the fall 2020 his heartburn recurred and had to double his Prevacid again to twice a day dosing.  Unfortunately, the patient states that in March 2021 he had COVID-19 and his symptoms worsened even more even taking the Prevacid.  He added Zantac twice a day to his regimen as these also provided some relief of his symptoms.  Due to this, the patient follow with his PCP who counseled him about weight loss measures. Patient reports he has tried eating better and has lost close to 10 lb. He has weaned off the Zantac recently. He has been consistently taking Protonix 40 mg BID - gives 30 minutes before eating a meal. He reports that currently he has occasional episodes of upper abdominal pain and burping, but reports that his heartburn is well controlled. He has some nausea occasionally but no vomiting. He has noticed some episodes of regurgitation and burping especially  with food that have tomatoes.  When present, his symptoms are worse at night. Has not taken a Maalox in 2-3 weeks which he believes is major improvement.  The patient denies having any  vomiting, fever, chills, hematochezia, melena, hematemesis, abdominal distention, abdominal pain, diarrhea, jaundice, pruritus.  Last EGD: 2007 Last Colonoscopy:never  FHx: neg for any gastrointestinal/liver disease, lung cancer father Social: neg smoking, alcohol or illicit drug use Surgical: cholecystectomy  Past Medical History: Past Medical History:  Diagnosis Date  . GERD (gastroesophageal reflux disease)   . Hypertension     Past Surgical History: Past Surgical History:  Procedure Laterality Date  . CHOLECYSTECTOMY      Family History: Family History  Problem Relation Age of Onset  . Dementia Mother   . GER disease Father   . Lung cancer Father   . Diabetes Father   . Healthy Sister     Social History: Social History   Tobacco Use  Smoking Status Never Smoker  Smokeless Tobacco Never Used   Social History   Substance and Sexual Activity  Alcohol Use No   Social History   Substance and Sexual Activity  Drug Use No    Allergies: No Known Allergies  Medications: Current Outpatient Medications  Medication Sig Dispense Refill  . aluminum-magnesium hydroxide-simethicone (MAALOX) 510-258-52 MG/5ML SUSP Take 30 mLs by mouth 4 (four) times daily -  before meals and at bedtime.    . enalapril (VASOTEC) 20 MG tablet TAKE 1 TABLET(20 MG) BY MOUTH TWICE DAILY (Patient taking differently: Take 20 mg by  mouth 2 (two) times daily. ) 30 tablet 0  . hydrochlorothiazide (HYDRODIURIL) 25 MG tablet TAKE 1/2 TABLET(12.5 MG) BY MOUTH DAILY (Patient taking differently: Take 12.5 mg by mouth daily. ) 45 tablet 1  . ibuprofen (ADVIL) 200 MG tablet Take 200 mg by mouth every 6 (six) hours as needed for moderate pain.    . Multiple Vitamin (MULTI VITAMIN DAILY PO) Take 1 tablet by mouth daily.     . pantoprazole (PROTONIX) 40 MG tablet Take 1 tablet (40 mg total) by mouth 2 (two) times daily. 60 tablet 1  . sucralfate (CARAFATE) 1 g tablet Take 1 tablet (1 g total) by mouth 4 (four) times daily -  with meals and at bedtime. 45 tablet 0   No current facility-administered medications for this visit.    Review of Systems: GENERAL: negative for malaise, night sweats HEENT: No changes in hearing or vision, no nose bleeds or other nasal problems. NECK: Negative for lumps, goiter, pain and significant neck swelling RESPIRATORY: Negative for cough, wheezing CARDIOVASCULAR: Negative for chest pain, leg swelling, palpitations, orthopnea GI: SEE HPI MUSCULOSKELETAL: Negative for joint pain or swelling, back pain, and muscle pain. SKIN: Negative for lesions, rash PSYCH: Negative for sleep disturbance, mood disorder and recent psychosocial stressors. HEMATOLOGY Negative for prolonged bleeding, bruising easily, and swollen nodes. ENDOCRINE: Negative for cold or heat intolerance, polyuria, polydipsia and goiter. NEURO: negative for tremor, gait imbalance, syncope and seizures. The remainder of the review of systems is noncontributory.   Physical Exam: BP 128/81 (BP Location: Left Arm, Patient Position: Sitting, Cuff Size: Normal)   Pulse 86   Temp 98.6 F (37 C) (Oral)   Ht 6' 3"  (1.905 m)   Wt (!) 396 lb 3.2 oz (179.7 kg)   BMI 49.52 kg/m  GENERAL: The patient is AO x3, in no acute distress.  Obese. HEENT: Head is normocephalic and atraumatic. EOMI are intact. Mouth is well hydrated and without lesions. NECK: Supple. No masses LUNGS: Clear to auscultation. No presence of rhonchi/wheezing/rales. Adequate chest expansion HEART: RRR, normal s1 and s2. ABDOMEN: Soft, nontender, no guarding, no peritoneal signs, and nondistended. BS +. No masses. EXTREMITIES: Without any cyanosis, clubbing, rash, lesions or edema. NEUROLOGIC: AOx3, no focal motor deficit. SKIN: no jaundice, no  rashes   Imaging/Labs: as above  I personally reviewed and interpreted the available labs, imaging and endoscopic files.  Impression and Plan: [name] is a 44 y.o. male with PMH HTN who presents for evaluation of GERD.  Patient has presented symptoms of recurrent GERD which have been associated to weight gain in the past.  I consider that this has had a major role in the control of his symptoms.  However, he has been able to achieve better control of it since he has changed his dietary habits.  I encouraged him to keep losing weight as this will provide better control of his GERD overall.  For now, he should continue taking pantoprazole 40 mg twice a day and if he feels that his symptoms are not present at all he can try decreasing the dose to once a day dosing.  At this point, there is no need to perform an endoscopic evaluation, which will be considered in the future if he has recurrent heartburn while taking the medication compliantly.  - Patient encouraged to lose weight as this may provide benefit for his specific complaint but also due to other comorbidities - Continue pantoprazole 40 mg q12h - Explained presumed etiology of  reflux symptoms. Instruction provided in the use of antireflux medication - patient should take medication in the morning 30-45 minutes before eating breakfast. Discussed avoidance of eating within 2 hours of lying down to sleep and benefit of blocks to elevate head of bed. - RTC 3 months  All questions were answered.      Vincent Peppers, MD Gastroenterology and Hepatology [facility]

## 2020-01-21 ENCOUNTER — Other Ambulatory Visit: Payer: Self-pay | Admitting: Family Medicine

## 2020-01-22 ENCOUNTER — Encounter (HOSPITAL_COMMUNITY): Payer: Self-pay

## 2020-01-23 ENCOUNTER — Other Ambulatory Visit (HOSPITAL_COMMUNITY): Payer: Self-pay

## 2020-02-10 ENCOUNTER — Telehealth: Payer: Self-pay | Admitting: Family Medicine

## 2020-02-10 MED ORDER — HYDROCHLOROTHIAZIDE 25 MG PO TABS
12.5000 mg | ORAL_TABLET | Freq: Every day | ORAL | 0 refills | Status: DC
Start: 2020-02-10 — End: 2020-04-14

## 2020-02-10 NOTE — Telephone Encounter (Signed)
Med refill sent to pharmacy

## 2020-02-10 NOTE — Telephone Encounter (Signed)
Yes pls refill for 30 days. Thx. Dr. Lovena Le

## 2020-02-10 NOTE — Telephone Encounter (Signed)
[facility] [address] requesting refill on HCTZ 25 mg tablet. Take 1/2 tablet po daily. Pt last seen 12/19/19. Please advise. Thank you

## 2020-02-21 ENCOUNTER — Ambulatory Visit: Payer: Self-pay | Admitting: Student

## 2020-03-19 ENCOUNTER — Ambulatory Visit: Payer: Self-pay | Admitting: Family Medicine

## 2020-04-11 ENCOUNTER — Other Ambulatory Visit: Payer: Self-pay | Admitting: Family Medicine

## 2020-04-16 ENCOUNTER — Telehealth (INDEPENDENT_AMBULATORY_CARE_PROVIDER_SITE_OTHER): Payer: 59 | Admitting: Gastroenterology

## 2020-04-16 ENCOUNTER — Other Ambulatory Visit: Payer: Self-pay

## 2020-04-16 ENCOUNTER — Encounter (INDEPENDENT_AMBULATORY_CARE_PROVIDER_SITE_OTHER): Payer: Self-pay | Admitting: Gastroenterology

## 2020-04-16 VITALS — Ht 75.0 in | Wt 396.0 lb

## 2020-04-16 DIAGNOSIS — K219 Gastro-esophageal reflux disease without esophagitis: Secondary | ICD-10-CM | POA: Diagnosis not present

## 2020-04-16 NOTE — Patient Instructions (Signed)
Decrease pantoprazole dose to 40 mg every day

## 2020-04-16 NOTE — Progress Notes (Signed)
[name], M.D. Gastroenterology & Hepatology [facility] [address] [place], [place] [address] Primary Care Physician: Erven Colla, DO [place] [address]  This is a telephone virtual visit.  It required patient-provider interaction for the medical decision making as documented below. The patient has consented and agreed to proceed with a Telehealth encounter given the current Coronavirus pandemic.  VIRTUAL VISIT NOTE Patient location: Home Provider location: Office  I will communicate my assessment and recommendations to the referring MD via EMR.  Problems 1.  GERD  History of Present Illness: MANPREET STREY is a 45 y.o. male PMH HTN and GERD, who presents for follow up of GERD.  The patient was last seen on 01/16/2020. At that time, the patient was continued on pantoprazole 40 mg q12h and implemented lifestyle changes.  Patient states feeling better while taking the medication which he is taking compliantly, feels peppers or spicy food may cause a recurrent episode occasionally but this is infrequent.  He otherwise feels good and denies having any complaints. Patient has lost close 10 lb due to lifestyle modifications and he is happy about it, although he gained some weight during the holidays. The patient denies having any dysphagia, odynophagia, nausea, vomiting, fever, chills, hematochezia, melena, hematemesis, abdominal distention, abdominal pain, diarrhea, jaundice, pruritus.   Last Colonoscopy: Never  Past Medical History: Past Medical History:  Diagnosis Date  . GERD (gastroesophageal reflux disease)   . Hypertension     Past Surgical History: Past Surgical History:  Procedure Laterality Date  . CHOLECYSTECTOMY      Family History: Family History  Problem Relation Age of Onset  . Dementia Mother   . GER disease Father   . Lung cancer Father   . Diabetes Father   . Healthy Sister      Social History: Social History   Tobacco Use  Smoking Status Never Smoker  Smokeless Tobacco Never Used   Social History   Substance and Sexual Activity  Alcohol Use No   Social History   Substance and Sexual Activity  Drug Use No    Allergies: No Known Allergies  Medications: Current Outpatient Medications  Medication Sig Dispense Refill  . enalapril (VASOTEC) 20 MG tablet TAKE 1 TABLET(20 MG) BY MOUTH TWICE DAILY 30 tablet 5  . hydrochlorothiazide (HYDRODIURIL) 25 MG tablet TAKE 1/2 TABLET(12.5 MG) BY MOUTH DAILY 30 tablet 0  . Multiple Vitamin (MULTI VITAMIN DAILY PO) Take 1 tablet by mouth daily.    . pantoprazole (PROTONIX) 40 MG tablet Take 1 tablet (40 mg total) by mouth 2 (two) times daily. 60 tablet 2  . aluminum-magnesium hydroxide-simethicone (MAALOX) [phone] MG/5ML SUSP Take 30 mLs by mouth 4 (four) times daily -  before meals and at bedtime. (Patient not taking: Reported on 04/16/2020)    . ibuprofen (ADVIL) 200 MG tablet Take 200 mg by mouth every 6 (six) hours as needed for moderate pain.  (Patient not taking: Reported on 04/16/2020)    . sucralfate (CARAFATE) 1 g tablet Take 1 tablet (1 g total) by mouth 4 (four) times daily -  with meals and at bedtime. (Patient not taking: No sig reported) 45 tablet 0   No current facility-administered medications for this visit.    Review of Systems: GENERAL: negative for malaise, night sweats HEENT: No changes in hearing or vision, no nose bleeds or other nasal problems. NECK: Negative for lumps, goiter, pain and significant neck swelling RESPIRATORY: Negative for  cough, wheezing CARDIOVASCULAR: Negative for chest pain, leg swelling, palpitations, orthopnea GI: SEE HPI MUSCULOSKELETAL: Negative for joint pain or swelling, back pain, and muscle pain. SKIN: Negative for lesions, rash PSYCH: Negative for sleep disturbance, mood disorder and recent psychosocial stressors. HEMATOLOGY Negative for prolonged bleeding,  bruising easily, and swollen nodes. ENDOCRINE: Negative for cold or heat intolerance, polyuria, polydipsia and goiter. NEURO: negative for tremor, gait imbalance, syncope and seizures. The remainder of the review of systems is noncontributory.   Physical Exam: No exam was performed as this was a telephone encounter  Imaging/Labs: as above  I personally reviewed and interpreted the available labs, imaging and endoscopic files.  Impression and Plan: TYREKE KAESER is a 45 y.o. male PMH HTN and GERD, who presents for follow up of GERD.  The patient has presented improvement of his symptoms while taking pantoprazole.  He has not presented any red flag signs and has tolerated the medication adequately.  It is very likely that his lifestyle modifications and weight loss have led to part of the improvement of his symptoms.  Given the fact that he is presenting very rare symptoms, I recommended to him decreasing his dose to once a day dosing.  He may go back to twice a day dosing if his symptoms recur.  I also had a thorough discussion with him regarding colorectal cancer screening with colonoscopy as he will be 45 years old soon.  The patient reported that he would like to think about it and will give [place] a call back once he has made a decision about the timing of his colonoscopy.  - Decrease pantoprazole dose to 40 mg every day - RTC 1 year  All questions were answered.      Total visit time: I spent a total of  20 minutes  [name], MD Gastroenterology and Hepatology [facility]

## 2020-04-23 ENCOUNTER — Encounter (INDEPENDENT_AMBULATORY_CARE_PROVIDER_SITE_OTHER): Payer: 59 | Admitting: Family Medicine

## 2020-04-23 DIAGNOSIS — I10 Essential (primary) hypertension: Secondary | ICD-10-CM

## 2020-04-23 NOTE — Progress Notes (Signed)
Cancelled appt per Dr. Lovena Le

## 2020-04-24 ENCOUNTER — Telehealth (INDEPENDENT_AMBULATORY_CARE_PROVIDER_SITE_OTHER): Payer: Self-pay | Admitting: Gastroenterology

## 2020-04-24 ENCOUNTER — Other Ambulatory Visit (INDEPENDENT_AMBULATORY_CARE_PROVIDER_SITE_OTHER): Payer: Self-pay

## 2020-04-24 DIAGNOSIS — K219 Gastro-esophageal reflux disease without esophagitis: Secondary | ICD-10-CM

## 2020-04-24 MED ORDER — PANTOPRAZOLE SODIUM 40 MG PO TBEC: 40.0000 mg | DELAYED_RELEASE_TABLET | Freq: Two times a day (BID) | ORAL | 2 refills | Status: DC

## 2020-04-24 NOTE — Telephone Encounter (Signed)
Patient called the office stated he needs a refill on his Protonix - would like it to be sent to [facility] on [address] - please advise - ph# [phone]

## 2020-04-24 NOTE — Telephone Encounter (Signed)
Patient aware we have sent in a new prescription.

## 2020-04-29 ENCOUNTER — Encounter: Payer: Self-pay | Admitting: Family Medicine

## 2020-04-29 ENCOUNTER — Other Ambulatory Visit: Payer: Self-pay

## 2020-04-29 ENCOUNTER — Telehealth (INDEPENDENT_AMBULATORY_CARE_PROVIDER_SITE_OTHER): Payer: 59 | Admitting: Family Medicine

## 2020-04-29 DIAGNOSIS — I10 Essential (primary) hypertension: Secondary | ICD-10-CM

## 2020-04-29 NOTE — Progress Notes (Signed)
Patient ID: Vincent Hahn, male    DOB: May 04, 1975, 45 y.o.   MRN: [id number]   No chief complaint on file.  Subjective:  CC: medication management for HTN  This is a chronic problem.  Presents today via telephone visit for medication management for hypertension.  Last seen in this office December 19, 2019 with suboptimal hypertension at that time.  Was 136/84, seen by Dr. Lovena Le.  Reports that he does not have any means to monitor his blood pressure at home, attempted to buy a cuff at [facility], cuff size not appropriate for his arm size.  Understands that telephone visit without blood pressure readings is not adequate for medication management visit.  Last lab work done December 19, 2019 normal kidney and liver functions at that time.  Reports that he has lost 10 pounds since visit in September, has seen GI for acid reflux, this is now managed.   med check up. Has not had bp checked since while at ED in October.   Virtual Visit via Telephone Note  I connected with Vincent Hahn on 04/29/20 at 10:00 AM EST by telephone and verified that I am speaking with the correct person using two identifiers.  Location: Patient: home Provider: office   I discussed the limitations, risks, security and privacy concerns of performing an evaluation and management service by telephone and the availability of in person appointments. I also discussed with the patient that there may be a patient responsible charge related to this service. The patient expressed understanding and agreed to proceed.   History of Present Illness:    Observations/Objective:   Assessment and Plan:   Follow Up Instructions:    I discussed the assessment and treatment plan with the patient. The patient was provided an opportunity to ask questions and all were answered. The patient agreed with the plan and demonstrated an understanding of the instructions.   The patient was advised to call back or seek an  in-person evaluation if the symptoms worsen or if the condition fails to improve as anticipated.  I provided 14 minutes of non-face-to-face time during this encounter.     Medical History Vincent Hahn has a past medical history of GERD (gastroesophageal reflux disease) and Hypertension.   Outpatient Encounter Medications as of 04/29/2020  Medication Sig  . enalapril (VASOTEC) 20 MG tablet TAKE 1 TABLET(20 MG) BY MOUTH TWICE DAILY  . hydrochlorothiazide (HYDRODIURIL) 25 MG tablet TAKE 1/2 TABLET(12.5 MG) BY MOUTH DAILY  . Multiple Vitamin (MULTI VITAMIN DAILY PO) Take 1 tablet by mouth daily.  . pantoprazole (PROTONIX) 40 MG tablet Take 1 tablet (40 mg total) by mouth 2 (two) times daily.  . [DISCONTINUED] ibuprofen (ADVIL) 200 MG tablet Take 200 mg by mouth every 6 (six) hours as needed for moderate pain.  (Patient not taking: Reported on 04/16/2020)   No facility-administered encounter medications on file as of 04/29/2020.     Review of Systems  Constitutional: Negative for chills and fever.  Respiratory: Negative for shortness of breath.   Cardiovascular: Negative for chest pain.  Neurological: Negative for headaches.     Vitals There were no vitals taken for this visit. Unable  Objective:   Physical Exam  No PE, able to converse throughout phone visit without obvious shortness of breath. Assessment and Plan   1. Essential hypertension   Telephone visit with out blood pressure readings not adequate for medication management for hypertension.  He will make appointment for in person visit in 3  weeks, still has medications, no refills needed today.  He will attempt to buy a blood pressure cuff that fits his arm for future use.  Explained the office is doing wellness visits in the morning, sick visits in the afternoon, attempting to avoid exposing well patients to sick patients.  Agrees with plan of care discussed today. Understands warning signs to seek further care: chest pain,  shortness of breath, any significant change in health.  Understands to follow-up in 3 weeks, in person visit for medication management for hypertension.   Vincent Ades, NP 04/29/2020

## 2020-05-04 ENCOUNTER — Ambulatory Visit: Payer: 59 | Admitting: Family Medicine

## 2020-05-10 MED ORDER — ENALAPRIL MALEATE 20 MG PO TABS: ORAL_TABLET | ORAL | 1 refills | Status: DC

## 2020-05-20 ENCOUNTER — Ambulatory Visit (INDEPENDENT_AMBULATORY_CARE_PROVIDER_SITE_OTHER): Payer: 59 | Admitting: Family Medicine

## 2020-05-20 ENCOUNTER — Encounter: Payer: Self-pay | Admitting: Family Medicine

## 2020-05-20 ENCOUNTER — Other Ambulatory Visit: Payer: Self-pay

## 2020-05-20 ENCOUNTER — Other Ambulatory Visit: Payer: Self-pay | Admitting: Family Medicine

## 2020-05-20 VITALS — BP 128/76 | HR 87 | Temp 97.4°F | Ht 75.0 in | Wt >= 6400 oz

## 2020-05-20 DIAGNOSIS — R7303 Prediabetes: Secondary | ICD-10-CM

## 2020-05-20 DIAGNOSIS — I10 Essential (primary) hypertension: Secondary | ICD-10-CM | POA: Diagnosis not present

## 2020-05-20 DIAGNOSIS — Z13220 Encounter for screening for lipoid disorders: Secondary | ICD-10-CM

## 2020-05-20 MED ORDER — HYDROCHLOROTHIAZIDE 25 MG PO TABS: ORAL_TABLET | ORAL | 0 refills | Status: DC

## 2020-05-20 NOTE — Patient Instructions (Addendum)
Try to get a blood pressure cuff that fits your arm Take it several times a month to see if it stays controlled.  Wellness  Recommend 150 minutes per week of exercise such as walking. Recommend lots of fresh produce to include fruits, vegetables, beans, healthy fats such as avocado, nuts, seeds, and 3-6 ounces of protein at each meal.  Avoid fried foods and fast food. Limit alcohol consumption: no more than one drink per day for women and 2 drinks per day for men.       DASH Eating Plan DASH stands for Dietary Approaches to Stop Hypertension. The DASH eating plan is a healthy eating plan that has been shown to:  Reduce high blood pressure (hypertension).  Reduce your risk for type 2 diabetes, heart disease, and stroke.  Help with weight loss. What are tips for following this plan? Reading food labels  Check food labels for the amount of salt (sodium) per serving. Choose foods with less than 5 percent of the Daily Value of sodium. Generally, foods with less than 300 milligrams (mg) of sodium per serving fit into this eating plan.  To find whole grains, look for the word "whole" as the first word in the ingredient list. Shopping  Buy products labeled as "low-sodium" or "no salt added."  Buy fresh foods. Avoid canned foods and pre-made or frozen meals. Cooking  Avoid adding salt when cooking. Use salt-free seasonings or herbs instead of table salt or sea salt. Check with your health care provider or pharmacist before using salt substitutes.  Do not fry foods. Cook foods using healthy methods such as baking, boiling, grilling, roasting, and broiling instead.  Cook with heart-healthy oils, such as olive, canola, avocado, soybean, or sunflower oil. Meal planning  Eat a balanced diet that includes: ? 4 or more servings of fruits and 4 or more servings of vegetables each day. Try to fill one-half of your plate with fruits and vegetables. ? 6-8 servings of whole grains each  day. ? Less than 6 oz (170 g) of lean meat, poultry, or fish each day. A 3-oz (85-g) serving of meat is about the same size as a deck of cards. One egg equals 1 oz (28 g). ? 2-3 servings of low-fat dairy each day. One serving is 1 cup (237 mL). ? 1 serving of nuts, seeds, or beans 5 times each week. ? 2-3 servings of heart-healthy fats. Healthy fats called omega-3 fatty acids are found in foods such as walnuts, flaxseeds, fortified milks, and eggs. These fats are also found in cold-water fish, such as sardines, salmon, and mackerel.  Limit how much you eat of: ? Canned or prepackaged foods. ? Food that is high in trans fat, such as some fried foods. ? Food that is high in saturated fat, such as fatty meat. ? Desserts and other sweets, sugary drinks, and other foods with added sugar. ? Full-fat dairy products.  Do not salt foods before eating.  Do not eat more than 4 egg yolks a week.  Try to eat at least 2 vegetarian meals a week.  Eat more home-cooked food and less restaurant, buffet, and fast food.   Lifestyle  When eating at a restaurant, ask that your food be prepared with less salt or no salt, if possible.  If you drink alcohol: ? Limit how much you use to:  0-1 drink a day for women who are not pregnant.  0-2 drinks a day for men. ? Be aware of how much  alcohol is in your drink. In the [place] one drink equals one 12 oz bottle of beer (355 mL), one 5 oz glass of wine (148 mL), or one 1 oz glass of hard liquor (44 mL). General information  Avoid eating more than 2,300 mg of salt a day. If you have hypertension, you may need to reduce your sodium intake to 1,500 mg a day.  Work with your health care provider to maintain a healthy body weight or to lose weight. Ask what an ideal weight is for you.  Get at least 30 minutes of exercise that causes your heart to beat faster (aerobic exercise) most days of the week. Activities may include walking, swimming, or biking.  Work with  your health care provider or dietitian to adjust your eating plan to your individual calorie needs. What foods should I eat? Fruits All fresh, dried, or frozen fruit. Canned fruit in natural juice (without added sugar). Vegetables Fresh or frozen vegetables (raw, steamed, roasted, or grilled). Low-sodium or reduced-sodium tomato and vegetable juice. Low-sodium or reduced-sodium tomato sauce and tomato paste. Low-sodium or reduced-sodium canned vegetables. Grains Whole-grain or whole-wheat bread. Whole-grain or whole-wheat pasta. Brown rice. [place]ena Morrow. Bulgur. Whole-grain and low-sodium cereals. Pita bread. Low-fat, low-sodium crackers. Whole-wheat flour tortillas. Meats and other proteins Skinless chicken or [place]. Ground chicken or [place]. Pork with fat trimmed off. Fish and seafood. Egg whites. Dried beans, peas, or lentils. Unsalted nuts, nut butters, and seeds. Unsalted canned beans. Lean cuts of beef with fat trimmed off. Low-sodium, lean precooked or cured meat, such as sausages or meat loaves. Dairy Low-fat (1%) or fat-free (skim) milk. Reduced-fat, low-fat, or fat-free cheeses. Nonfat, low-sodium ricotta or cottage cheese. Low-fat or nonfat yogurt. Low-fat, low-sodium cheese. Fats and oils Soft margarine without trans fats. Vegetable oil. Reduced-fat, low-fat, or light mayonnaise and salad dressings (reduced-sodium). Canola, safflower, olive, avocado, soybean, and sunflower oils. Avocado. Seasonings and condiments Herbs. Spices. Seasoning mixes without salt. Other foods Unsalted popcorn and pretzels. Fat-free sweets. The items listed above may not be a complete list of foods and beverages you can eat. Contact a dietitian for more information. What foods should I avoid? Fruits Canned fruit in a light or heavy syrup. Fried fruit. Fruit in cream or butter sauce. Vegetables Creamed or fried vegetables. Vegetables in a cheese sauce. Regular canned vegetables (not low-sodium or  reduced-sodium). Regular canned tomato sauce and paste (not low-sodium or reduced-sodium). Regular tomato and vegetable juice (not low-sodium or reduced-sodium). Angie Fava. Olives. Grains Baked goods made with fat, such as croissants, muffins, or some breads. Dry pasta or rice meal packs. Meats and other proteins Fatty cuts of meat. Ribs. Fried meat. Berniece Salines. Bologna, salami, and other precooked or cured meats, such as sausages or meat loaves. Fat from the back of a pig (fatback). Bratwurst. Salted nuts and seeds. Canned beans with added salt. Canned or smoked fish. Whole eggs or egg yolks. Chicken or [place] with skin. Dairy Whole or 2% milk, cream, and half-and-half. Whole or full-fat cream cheese. Whole-fat or sweetened yogurt. Full-fat cheese. Nondairy creamers. Whipped toppings. Processed cheese and cheese spreads. Fats and oils Butter. Stick margarine. Lard. Shortening. Ghee. Bacon fat. Tropical oils, such as coconut, palm kernel, or palm oil. Seasonings and condiments Onion salt, garlic salt, seasoned salt, table salt, and sea salt. Worcestershire sauce. Tartar sauce. Barbecue sauce. Teriyaki sauce. Soy sauce, including reduced-sodium. Steak sauce. Canned and packaged gravies. Fish sauce. Oyster sauce. Cocktail sauce. Store-bought horseradish. Ketchup. Mustard. Meat flavorings and tenderizers. Bouillon  cubes. Hot sauces. Pre-made or packaged marinades. Pre-made or packaged taco seasonings. Relishes. Regular salad dressings. Other foods Salted popcorn and pretzels. The items listed above may not be a complete list of foods and beverages you should avoid. Contact a dietitian for more information. Where to find more information  National Heart, Lung, and Blood Institute: https://wilson-eaton.com/  American Heart Association: www.heart.org  Academy of Nutrition and Dietetics: www.eatright.[place]: www.kidney.org Summary  The DASH eating plan is a healthy eating plan that has  been shown to reduce high blood pressure (hypertension). It may also reduce your risk for type 2 diabetes, heart disease, and stroke.  When on the DASH eating plan, aim to eat more fresh fruits and vegetables, whole grains, lean proteins, low-fat dairy, and heart-healthy fats.  With the DASH eating plan, you should limit salt (sodium) intake to 2,300 mg a day. If you have hypertension, you may need to reduce your sodium intake to 1,500 mg a day.  Work with your health care provider or dietitian to adjust your eating plan to your individual calorie needs. This information is not intended to replace advice given to you by your health care provider. Make sure you discuss any questions you have with your health care provider. Document Revised: 02/22/2019 Document Reviewed: 02/22/2019 Elsevier Patient Education  2021 [employer].

## 2020-05-20 NOTE — Progress Notes (Signed)
Pt here for med follow up. Pt states he has had no issues with blood pressure. Not checking it outside of office. Taking meds as prescribed.     Patient ID: Vincent Hahn, male    DOB: Dec 05, 1975, 45 y.o.   MRN: [id number]   Chief Complaint  Patient presents with  . Hypertension   Subjective:  CC: medication management  Presents today for medication management for hypertension.  Last lab work done October 2021 renal functions good.  Last A1c 5.7 equals prediabetes.  Lifestyle modification will be discussed today.  Denies fever, chills, chest pain, shortness of breath.  Does have acid reflux.  Request medication refills today.    Medical History Vincent Hahn has a past medical history of GERD (gastroesophageal reflux disease) and Hypertension.   Outpatient Encounter Medications as of 05/20/2020  Medication Sig  . enalapril (VASOTEC) 20 MG tablet TAKE 1 TABLET(20 MG) BY MOUTH TWICE DAILY  . Multiple Vitamin (MULTI VITAMIN DAILY PO) Take 1 tablet by mouth daily.  . pantoprazole (PROTONIX) 40 MG tablet Take 1 tablet (40 mg total) by mouth 2 (two) times daily.  . [DISCONTINUED] hydrochlorothiazide (HYDRODIURIL) 25 MG tablet TAKE 1/2 TABLET(12.5 MG) BY MOUTH DAILY  . hydrochlorothiazide (HYDRODIURIL) 25 MG tablet Take one half tablet my mouth daily.   No facility-administered encounter medications on file as of 05/20/2020.     Review of Systems  Constitutional: Negative for chills, fatigue, fever and unexpected weight change.  Eyes: Negative for visual disturbance.  Respiratory: Negative for chest tightness and shortness of breath.        Has acid reflux.   Cardiovascular: Negative for chest pain, palpitations and leg swelling.  Gastrointestinal: Negative for abdominal pain, blood in stool, diarrhea, nausea and vomiting.  Musculoskeletal: Negative for back pain.  Neurological: Negative for dizziness, light-headedness and headaches.     Vitals BP 128/76   Pulse 87   Temp (!) 97.4 F  (36.3 C)   Ht 6' 3"  (1.905 m)   Wt (!) 401 lb 12.8 oz (182.3 kg)   SpO2 95%   BMI 50.22 kg/m   Objective:   Physical Exam Vitals reviewed.  Constitutional:      General: He is not in acute distress. Cardiovascular:     Rate and Rhythm: Normal rate and regular rhythm.     Heart sounds: Normal heart sounds.  Pulmonary:     Effort: Pulmonary effort is normal.     Breath sounds: Normal breath sounds.  Skin:    General: Skin is warm and dry.  Neurological:     General: No focal deficit present.     Mental Status: He is alert.  Psychiatric:        Behavior: Behavior normal.      Assessment and Plan   1. Essential hypertension - hydrochlorothiazide (HYDRODIURIL) 25 MG tablet; Take one half tablet my mouth daily.  Dispense: 90 tablet; Refill: 0    Hypertension Medication compliance: takes as prescribed Denies chest pain, shortness of breath, lower extremity edema, vision changes, headaches.  Pertinent lab work: Last lab work done October 2021 kidney functions good. ASCVD 10- year risk score: Will perform at next visit. Monitoring: every 6 months with labs. Side effects: none  Continue current medication regimen: continue enalapril 20 mg twice daily, hydrochlorothiazide half tablet (12.5 mg) once daily.  Refill sent for hydrochlorothiazide, enalapril refill already sent earlier this month.  Diet and exercise/ lifestyle modifications discussed: Recommend 150 minutes per week of exercise such as walking.  Recommend lots of fresh produce to include fruits, vegetables, beans, healthy fats such as avocado, nuts, seeds, and 3-6 ounces of protein at each meal.  Avoid fried foods and fast food. Limit alcohol consumption: no more than one drink per day for women and 2 drinks per day for men.    Chalmers Guest, NP 05/20/2020

## 2020-07-20 ENCOUNTER — Other Ambulatory Visit (INDEPENDENT_AMBULATORY_CARE_PROVIDER_SITE_OTHER): Payer: Self-pay | Admitting: Gastroenterology

## 2020-07-20 DIAGNOSIS — K219 Gastro-esophageal reflux disease without esophagitis: Secondary | ICD-10-CM

## 2020-09-23 ENCOUNTER — Other Ambulatory Visit (INDEPENDENT_AMBULATORY_CARE_PROVIDER_SITE_OTHER): Payer: Self-pay | Admitting: Gastroenterology

## 2020-09-23 DIAGNOSIS — K219 Gastro-esophageal reflux disease without esophagitis: Secondary | ICD-10-CM

## 2020-09-23 MED ORDER — PANTOPRAZOLE SODIUM 40 MG PO TBEC: 40.0000 mg | DELAYED_RELEASE_TABLET | Freq: Every day | ORAL | 3 refills | Status: DC

## 2020-09-23 NOTE — Addendum Note (Signed)
Addended by: Harvel Quale on: 09/23/2020 10:28 AM   Modules accepted: Orders

## 2020-11-16 ENCOUNTER — Other Ambulatory Visit: Payer: Self-pay | Admitting: Family Medicine

## 2020-11-16 DIAGNOSIS — I10 Essential (primary) hypertension: Secondary | ICD-10-CM

## 2020-11-17 ENCOUNTER — Other Ambulatory Visit: Payer: Self-pay

## 2020-11-17 ENCOUNTER — Encounter: Payer: Self-pay | Admitting: Family Medicine

## 2020-11-17 ENCOUNTER — Ambulatory Visit (INDEPENDENT_AMBULATORY_CARE_PROVIDER_SITE_OTHER): Payer: 59 | Admitting: Family Medicine

## 2020-11-17 DIAGNOSIS — R7301 Impaired fasting glucose: Secondary | ICD-10-CM

## 2020-11-17 DIAGNOSIS — I10 Essential (primary) hypertension: Secondary | ICD-10-CM | POA: Diagnosis not present

## 2020-11-17 NOTE — Progress Notes (Signed)
Patient ID: Vincent Hahn, male    DOB: [birth year], 45 y.o.   MRN: [id number]   Chief Complaint  Patient presents with   Hypertension   Subjective:    HPI Pt here for medication management for HTN. 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 Seeing systolic in 388I.  No issues with side effects. Taking enalapril and HCTZ as directed.  Pt states he tries to eat healthy but that only last a little while; states its so easy to fall back into cycle.   HTN Pt compliant with BP meds.  No SEs Denies chest pain, sob, LE swelling, or blurry vision.   Gerd- Pt was seeing GI- doctors and was started on protonix 27m bid.  Was on prevacid for a long time before this. Seeing Dr. CGerlene Fee - pt stating was told to start to taper off this to once per day then taper off to see how he does per GI.  Pt has not started to taper yet, been on this for about 6 months.  Has salt in diet and country ham.  Medical History WIdenhas a past medical history of GERD (gastroesophageal reflux disease) and Hypertension.   Outpatient Encounter Medications as of 11/17/2020  Medication Sig   enalapril (VASOTEC) 20 MG tablet TAKE 1 TABLET(20 MG) BY MOUTH TWICE DAILY   hydrochlorothiazide (HYDRODIURIL) 25 MG tablet Take one half tablet my mouth daily.   Multiple Vitamin (MULTI VITAMIN DAILY PO) Take 1 tablet by mouth daily.   pantoprazole (PROTONIX) 40 MG tablet Take 1 tablet (40 mg total) by mouth daily.   [DISCONTINUED] enalapril (VASOTEC) 20 MG tablet TAKE 1 TABLET(20 MG) BY MOUTH TWICE DAILY   [DISCONTINUED] pantoprazole (PROTONIX) 40 MG tablet Take 1 tablet (40 mg total) by mouth 2 (two) times daily.   No facility-administered encounter medications on file as of 11/17/2020.     Review of Systems  Constitutional:  Negative for chills and fever.  HENT:  Negative for congestion, rhinorrhea and sore throat.   Respiratory:  Negative for cough, shortness of breath and wheezing.   Cardiovascular:   Negative for chest pain and leg swelling.  Gastrointestinal:  Negative for abdominal pain, diarrhea, nausea and vomiting.  Genitourinary:  Negative for dysuria and frequency.  Skin:  Negative for rash.  Neurological:  Negative for dizziness, weakness and headaches.    Vitals BP 140/80   Pulse 94   Temp 98.1 F (36.7 C)   Wt (!) 405 lb 12.8 oz (184.1 kg)   SpO2 95%   BMI 50.72 kg/m   Objective:   Physical Exam Vitals and nursing note reviewed.  Constitutional:      General: He is not in acute distress.    Appearance: Normal appearance. He is obese. He is not ill-appearing.  Cardiovascular:     Rate and Rhythm: Normal rate and regular rhythm.     Pulses: Normal pulses.     Heart sounds: Normal heart sounds.  Pulmonary:     Effort: Pulmonary effort is normal. No respiratory distress.     Breath sounds: Normal breath sounds.  Musculoskeletal:        General: Normal range of motion.  Skin:    General: Skin is warm and dry.     Findings: No rash.  Neurological:     General: No focal deficit present.     Mental Status: He is alert and oriented to person, place, and time.  Psychiatric:  Mood and Affect: Mood normal.        Behavior: Behavior normal.        Thought Content: Thought content normal.        Judgment: Judgment normal.     Assessment and Plan   1. Morbid obesity ([place]) - Lipid panel  2. Essential hypertension - Lipid panel - CBC  3. Impaired fasting blood sugar - CMP14+EGFR - Hemoglobin A1c   Htn- suboptimal.  Cont to watch salt. Gave handout on dash diet. And increase in exercising. Watching salt.  Cont with meds.  Impaired fasting gluc- improved.  Cont to de carbs in diet and inc in exercising.  Obesity- cont to increase in exercising and dec carbs/cholesterol in diet.  Return in about 6 months (around 05/20/2021) for f/u htn.

## 2020-11-21 LAB — CMP14+EGFR
ALT: 28 IU/L (ref 0–44)
AST: 17 IU/L (ref 0–40)
Albumin/Globulin Ratio: 1.4 (ref 1.2–2.2)
Albumin: 4.2 g/dL (ref 4.0–5.0)
Alkaline Phosphatase: 109 IU/L (ref 44–121)
BUN/Creatinine Ratio: 13 (ref 9–20)
BUN: 11 mg/dL (ref 6–24)
Bilirubin Total: 0.3 mg/dL (ref 0.0–1.2)
CO2: 26 mmol/L (ref 20–29)
Calcium: 9.2 mg/dL (ref 8.7–10.2)
Chloride: 101 mmol/L (ref 96–106)
Creatinine, Ser: 0.83 mg/dL (ref 0.76–1.27)
Globulin, Total: 3 g/dL (ref 1.5–4.5)
Glucose: 106 mg/dL — ABNORMAL HIGH (ref 65–99)
Potassium: 4.6 mmol/L (ref 3.5–5.2)
Sodium: 141 mmol/L (ref 134–144)
Total Protein: 7.2 g/dL (ref 6.0–8.5)
eGFR: 110 mL/min/{1.73_m2} (ref >59)

## 2020-11-21 LAB — CBC
Hematocrit: 45.3 % (ref 37.5–51.0)
Hemoglobin: 14.8 g/dL (ref 13.0–17.7)
MCH: 26.7 pg (ref 26.6–33.0)
MCHC: 32.7 g/dL (ref 31.5–35.7)
MCV: 82 fL (ref 79–97)
Platelets: 271 10*3/uL (ref 150–450)
RBC: 5.54 x10E6/uL (ref 4.14–5.80)
RDW: 13.7 % (ref 11.6–15.4)
WBC: 8.9 10*3/uL (ref 3.4–10.8)

## 2020-11-21 LAB — LIPID PANEL
Chol/HDL Ratio: 5.3 ratio — ABNORMAL HIGH (ref 0.0–5.0)
Cholesterol, Total: 197 mg/dL (ref 100–199)
HDL: 37 mg/dL — ABNORMAL LOW (ref >39)
LDL Chol Calc (NIH): 125 mg/dL — ABNORMAL HIGH (ref 0–99)
Triglycerides: 195 mg/dL — ABNORMAL HIGH (ref 0–149)
VLDL Cholesterol Cal: 35 mg/dL (ref 5–40)

## 2020-11-21 LAB — HEMOGLOBIN A1C
Est. average glucose Bld gHb Est-mCnc: 131 mg/dL
Hgb A1c MFr Bld: 6.2 % — ABNORMAL HIGH (ref 4.8–5.6)

## 2020-12-03 ENCOUNTER — Other Ambulatory Visit: Payer: Self-pay | Admitting: Family Medicine

## 2020-12-03 DIAGNOSIS — I10 Essential (primary) hypertension: Secondary | ICD-10-CM

## 2021-02-09 ENCOUNTER — Other Ambulatory Visit (INDEPENDENT_AMBULATORY_CARE_PROVIDER_SITE_OTHER): Payer: Self-pay | Admitting: Gastroenterology

## 2021-02-09 DIAGNOSIS — K219 Gastro-esophageal reflux disease without esophagitis: Secondary | ICD-10-CM

## 2021-05-13 ENCOUNTER — Other Ambulatory Visit: Payer: Self-pay | Admitting: Family Medicine

## 2021-05-13 DIAGNOSIS — I10 Essential (primary) hypertension: Secondary | ICD-10-CM

## 2021-06-09 ENCOUNTER — Other Ambulatory Visit (INDEPENDENT_AMBULATORY_CARE_PROVIDER_SITE_OTHER): Payer: Self-pay | Admitting: Gastroenterology

## 2021-06-09 ENCOUNTER — Telehealth (INDEPENDENT_AMBULATORY_CARE_PROVIDER_SITE_OTHER): Payer: Self-pay | Admitting: *Deleted

## 2021-06-09 DIAGNOSIS — K219 Gastro-esophageal reflux disease without esophagitis: Secondary | ICD-10-CM

## 2021-06-09 MED ORDER — PANTOPRAZOLE SODIUM 40 MG PO TBEC: 40.0000 mg | DELAYED_RELEASE_TABLET | Freq: Two times a day (BID) | ORAL | 1 refills | Status: AC

## 2021-06-09 NOTE — Telephone Encounter (Signed)
Last seen jan 2022 and note states to decrease protonix to once daily but pt states he tried symptoms came back and he has been doing bid. Has appt 3/20. Is it ok to change directions back to bid and give enough til his appt.  ?

## 2021-06-09 NOTE — Telephone Encounter (Signed)
Yes, he can go back to bid and will refill medicine ? ?

## 2021-06-10 NOTE — Telephone Encounter (Signed)
Patient notified

## 2021-06-14 ENCOUNTER — Other Ambulatory Visit (INDEPENDENT_AMBULATORY_CARE_PROVIDER_SITE_OTHER): Payer: Self-pay

## 2021-06-15 ENCOUNTER — Ambulatory Visit: Payer: Self-pay | Admitting: Urology

## 2021-06-15 ENCOUNTER — Telehealth (INDEPENDENT_AMBULATORY_CARE_PROVIDER_SITE_OTHER): Payer: Self-pay

## 2021-06-15 NOTE — Telephone Encounter (Signed)
Per [employer] insurance Pantoprazole 40 mg one po bid approved from 06/15/2021-06/16/2022. Patient aware and will notify pharmacy to fill. ?

## 2021-06-21 ENCOUNTER — Ambulatory Visit (INDEPENDENT_AMBULATORY_CARE_PROVIDER_SITE_OTHER): Payer: Self-pay | Admitting: Gastroenterology

## 2021-12-08 IMAGING — DX DG CHEST 2V
2 series · 2 of 2 positions shown · non-contrast
Comparison: August 18, 2005

CLINICAL DATA: Chest pain

EXAM:
CHEST - 2 VIEW

[chest pa]
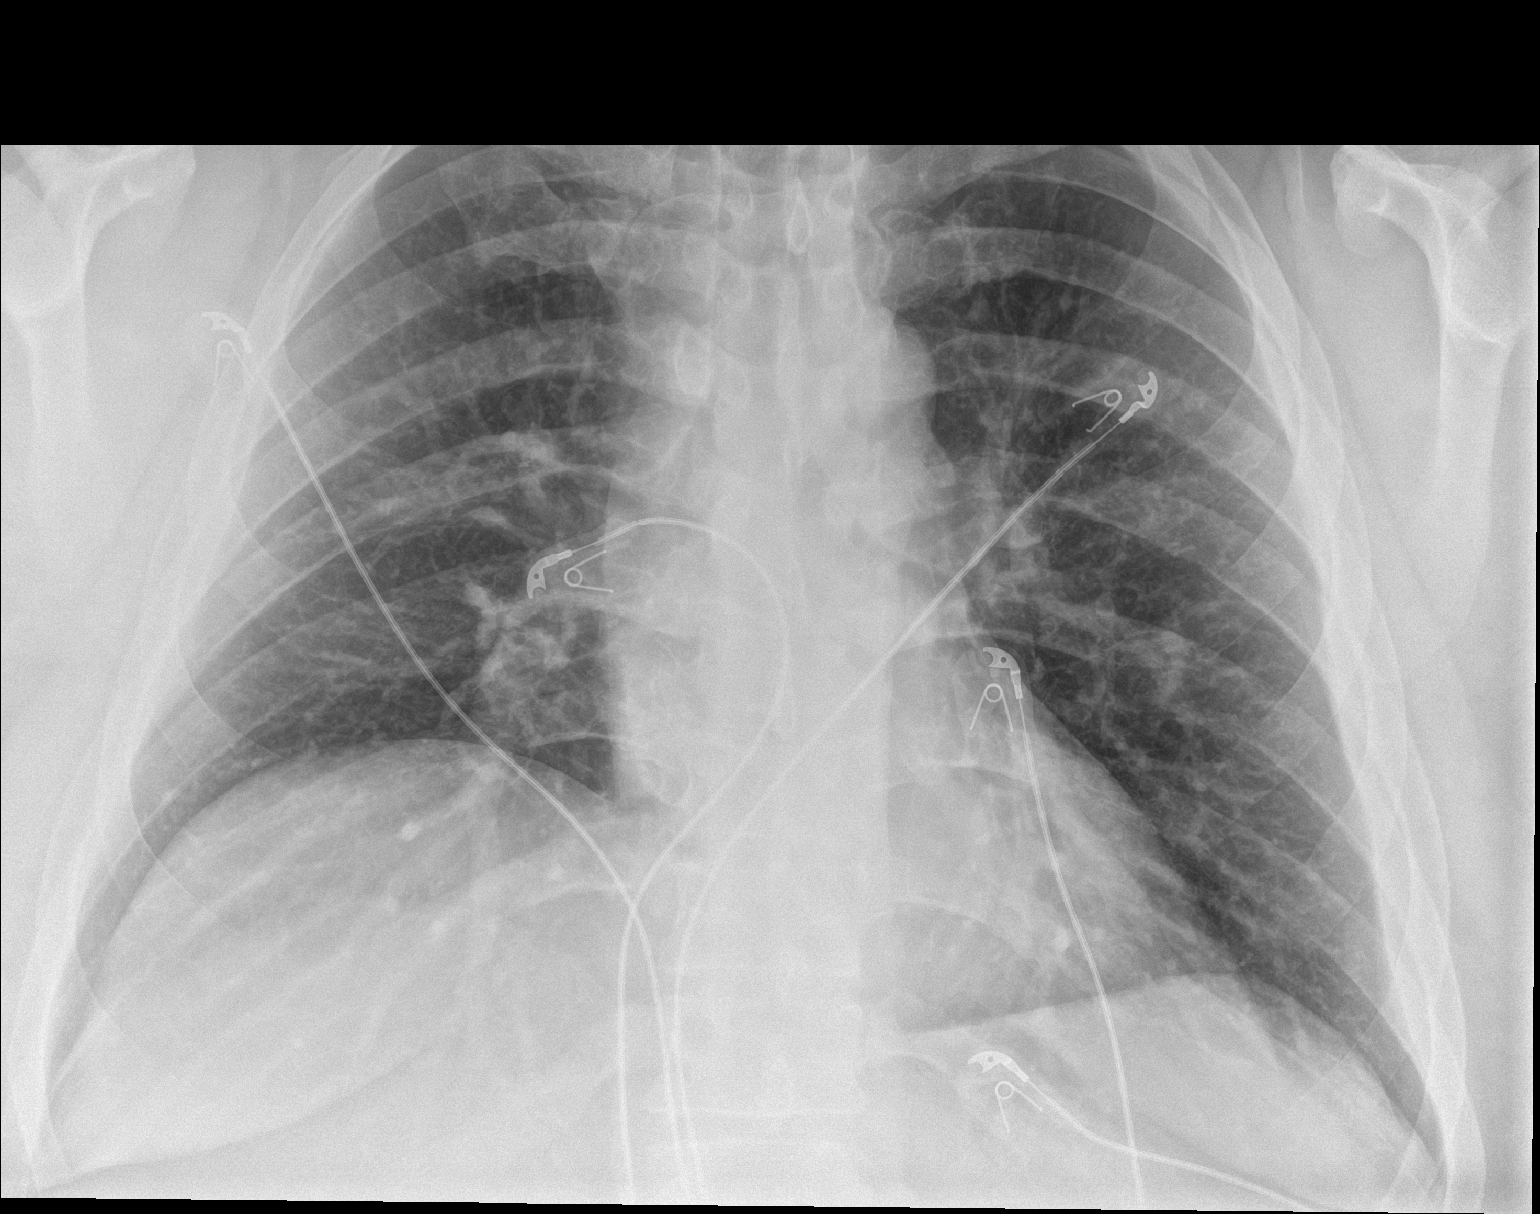

[chest lat]
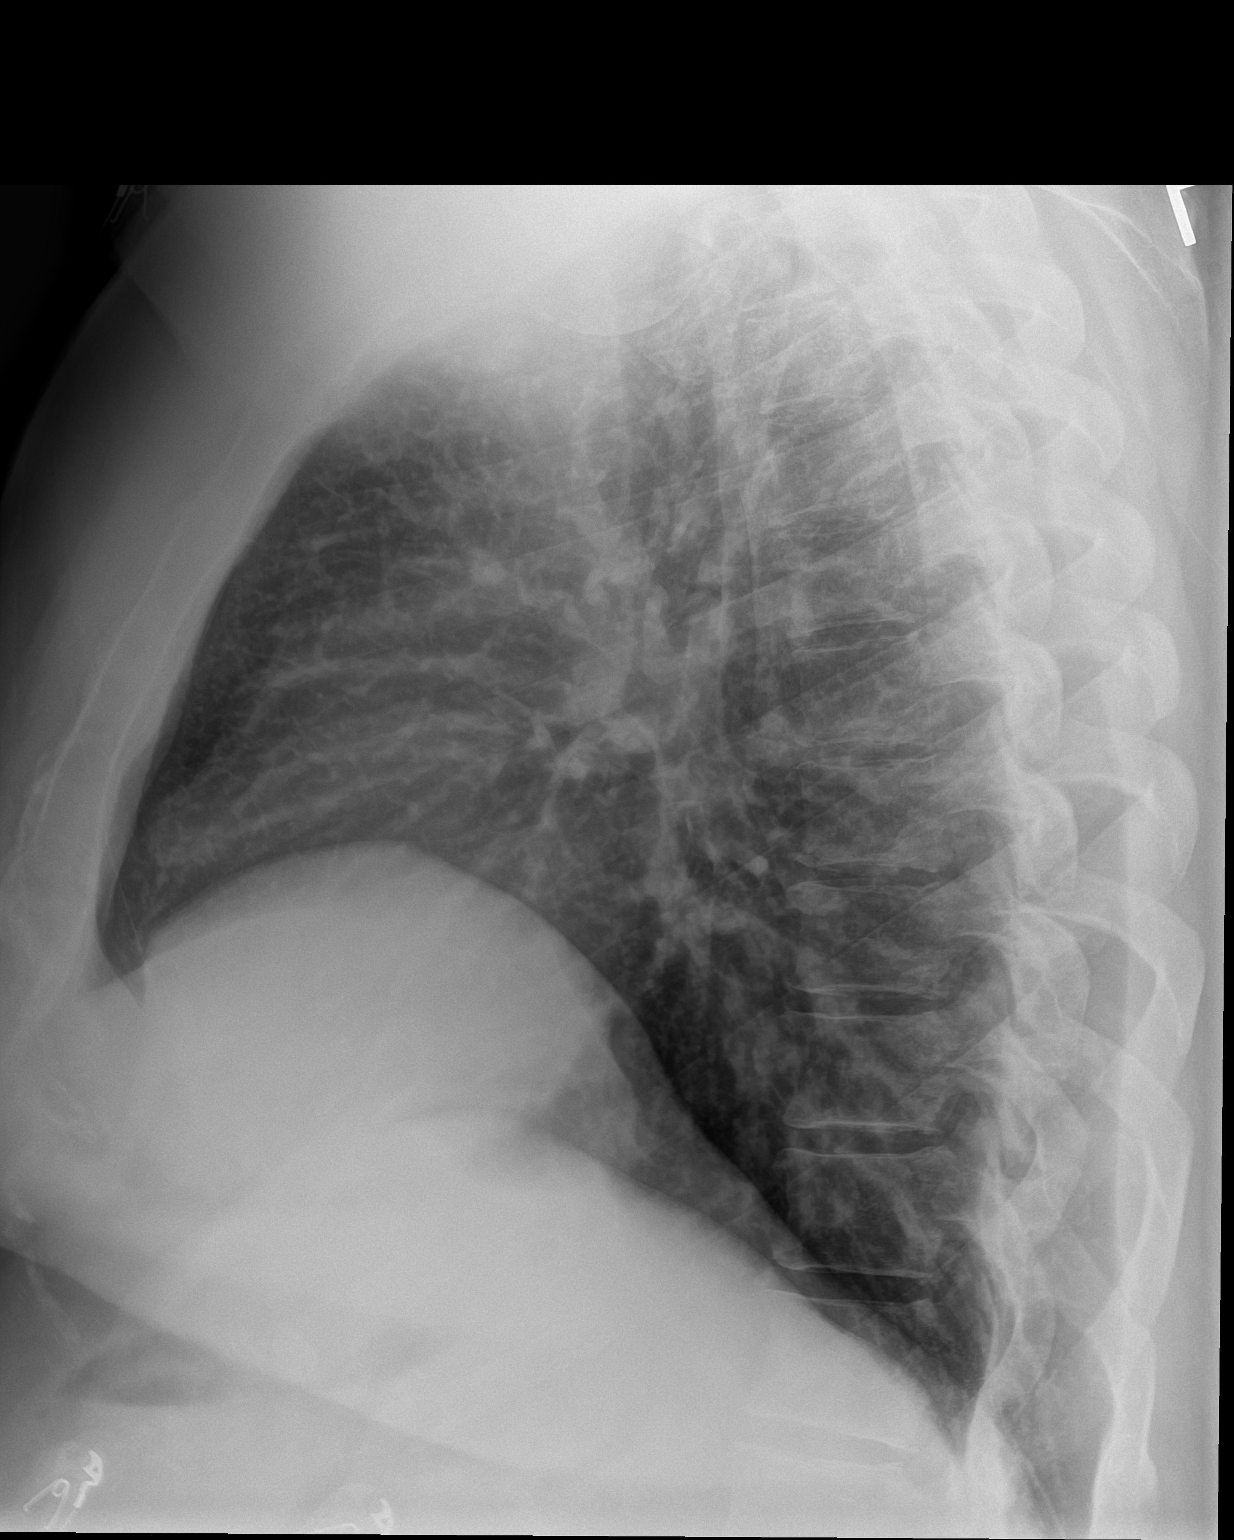

[2 of 2 positions shown; findings below may reference images not displayed]

FINDINGS: There is a degree of elevation of the right hemidiaphragm, stable.
No edema or airspace opacity. Heart size and pulmonary vascularity
are normal. No adenopathy. No bone lesions.
IMPRESSION: Lungs clear.  Cardiac silhouette within normal limits.

## 2021-12-23 DIAGNOSIS — Z5181 Encounter for therapeutic drug level monitoring: Secondary | ICD-10-CM | POA: Diagnosis not present

## 2022-06-06 DIAGNOSIS — L732 Hidradenitis suppurativa: Secondary | ICD-10-CM | POA: Diagnosis not present

## 2022-07-20 ENCOUNTER — Ambulatory Visit: Payer: 59 | Admitting: Dermatology

## 2022-07-20 ENCOUNTER — Encounter: Payer: Self-pay | Admitting: Dermatology

## 2022-07-20 VITALS — BP 112/71

## 2022-07-20 DIAGNOSIS — L723 Sebaceous cyst: Secondary | ICD-10-CM

## 2022-07-20 DIAGNOSIS — L732 Hidradenitis suppurativa: Secondary | ICD-10-CM | POA: Diagnosis not present

## 2022-07-20 MED ORDER — DOXYCYCLINE HYCLATE 100 MG PO CAPS: 100.0000 mg | ORAL_CAPSULE | Freq: Two times a day (BID) | ORAL | 0 refills | Status: DC

## 2022-07-20 MED ORDER — DOXYCYCLINE HYCLATE 100 MG PO CAPS: 100.0000 mg | ORAL_CAPSULE | Freq: Two times a day (BID) | ORAL | 0 refills | Status: AC

## 2022-07-20 NOTE — Patient Instructions (Addendum)
Due to recent changes in healthcare laws, you may see results of your pathology and/or laboratory studies on MyChart before the doctors have had a chance to review them. We understand that in some cases there may be results that are confusing or concerning to you. Please understand that not all results are received at the same time and often the doctors may need to interpret multiple results in order to provide you with the best plan of care or course of treatment. Therefore, we ask that you please give us 2 business days to thoroughly review all your results before contacting the office for clarification. Should we see a critical lab result, you will be contacted sooner.   If You Need Anything After Your Visit  If you have any questions or concerns for your doctor, please call our main line at [phone] If no one answers, please leave a voicemail as directed and we will return your call as soon as possible. Messages left after 4 pm will be answered the following business day.   You may also send us a message via MyChart. We typically respond to MyChart messages within 1-2 business days.  For prescription refills, please ask your pharmacy to contact our office. Our fax number is [phone].  If you have an urgent issue when the clinic is closed that cannot wait until the next business day, you can page your doctor at the number below.    Please note that while we do our best to be available for urgent issues outside of office hours, we are not available 24/7.   If you have an urgent issue and are unable to reach us, you may choose to seek medical care at your doctor's office, retail clinic, urgent care center, or emergency room.  If you have a medical emergency, please immediately call 911 or go to the emergency department. In the event of inclement weather, please call our main line at [phone] for an update on the status of any delays or closures.  Dermatology Medication Tips: Please  keep the boxes that topical medications come in in order to help keep track of the instructions about where and how to use these. Pharmacies typically print the medication instructions only on the boxes and not directly on the medication tubes.   If your medication is too expensive, please contact our office at [phone] or send us a message through MyChart.   We are unable to tell what your co-pay for medications will be in advance as this is different depending on your insurance coverage. However, we may be able to find a substitute medication at lower cost or fill out paperwork to get insurance to cover a needed medication.   If a prior authorization is required to get your medication covered by your insurance company, please allow us 1-2 business days to complete this process.  Drug prices often vary depending on where the prescription is filled and some pharmacies may offer cheaper prices.  The website www.goodrx.com contains coupons for medications through different pharmacies. The prices here do not account for what the cost may be with help from insurance (it may be cheaper with your insurance), but the website can give you the price if you did not use any insurance.  - You can print the associated coupon and take it with your prescription to the pharmacy.  - You may also stop by our office during regular business hours and pick up a GoodRx coupon card.  - If you need your   prescription sent electronically to a different pharmacy, notify our office through [place] MyChart or by phone at [phone]     

## 2022-07-20 NOTE — Progress Notes (Unsigned)
   New Patient Visit   Subjective  Vincent Hahn is a 47 y.o. male who presents for the following:  He has boils in the groin/thigh and abdominal crease area x 4-5 years. Has been flared since this past January. They can get painful and drain. He is on Doxycycline  daily which fluctuates in doses from 2 x daily to once daily. He had Mupirocin and is now using Neosporin.   The following portions of the chart were reviewed this encounter and updated as appropriate: medications, allergies, medical history  Review of Systems:  No other skin or systemic complaints except as noted in HPI or Assessment and Plan.  Objective  Well appearing patient in no apparent distress; mood and affect are within normal limits.   A focused examination was performed of the following areas: Groin, posterior scalp and abdominal crease  Relevant exam findings are noted in the Assessment and Plan.    Assessment & Plan   HIDRADENITIS SUPPURATIVA Exam: Some scars   Not at Goal  Hidradenitis Suppurativa is a chronic; persistent; non-curable, but treatable condition due to abnormal inflamed sweat glands in the body folds (axilla, inframammary, groin, medial thighs), causing recurrent painful draining cysts and scarring. It can be associated with severe scarring acne and cysts; also abscesses and scarring of scalp. The goal is control and prevention of flares, as it is not curable. Scars are permanent and can be thickened. Treatment may include daily use of topical medication and oral antibiotics.  Oral isotretinoin may also be helpful.  For some cases, Humira or Cosentyx (biologic injections) may be prescribed to decrease the inflammatory process and prevent flares.  When indicated, inflamed cysts may also be treated surgically.  Treatment Plan: Advised  Cerave 4 %  Benzoyl Peroxide wash daily in the shower Discussed ILK injections every 4-6 weeks for flares Continue Doxycycline for flares Reduce sugar  and red meat intake  Procedure Note Intralesional Injection  Location: Left thigh and right thigh  Informed Consent: Discussed risks (infection, pain, bleeding, bruising, thinning of the skin, loss of skin pigment, lack of resolution, and recurrence of lesion) and benefits of the procedure, as well as the alternatives. Informed consent was obtained. Preparation: The area was prepared a standard fashion.  Anesthesia: none  Procedure Details: An intralesional injection was performed with Kenalog 10 mg/cc. 0.4 cc in total were injected. NDC #: [id number] Exp: 03/2024  Total number of injections: 4 left inner thigh 1 right inner thigh  Plan: The patient was instructed on post-op care. Recommend OTC analgesia as needed for pain.     Return in about 3 months (around 10/19/2022) for HS.  Jaclynn Guarneri, CMA, am acting as scribe for Langston Reusing, MD.   Documentation: I have reviewed the above documentation for accuracy and completeness, and I agree with the above.  Langston Reusing, MD

## 2022-07-21 MED ORDER — TRIAMCINOLONE ACETONIDE 10 MG/ML IJ SUSP: 10.0000 mg | Freq: Once | INTRAMUSCULAR | Status: DC

## 2022-10-19 ENCOUNTER — Ambulatory Visit: Payer: 59 | Admitting: Dermatology

## 2022-10-26 ENCOUNTER — Ambulatory Visit: Payer: 59 | Admitting: Dermatology

## 2022-12-13 DIAGNOSIS — G4733 Obstructive sleep apnea (adult) (pediatric): Secondary | ICD-10-CM | POA: Diagnosis not present

## 2022-12-13 DIAGNOSIS — I10 Essential (primary) hypertension: Secondary | ICD-10-CM | POA: Diagnosis not present

## 2022-12-13 DIAGNOSIS — R7303 Prediabetes: Secondary | ICD-10-CM | POA: Diagnosis not present

## 2022-12-13 DIAGNOSIS — E559 Vitamin D deficiency, unspecified: Secondary | ICD-10-CM | POA: Diagnosis not present

## 2022-12-13 DIAGNOSIS — K219 Gastro-esophageal reflux disease without esophagitis: Secondary | ICD-10-CM | POA: Diagnosis not present

## 2022-12-20 ENCOUNTER — Ambulatory Visit: Payer: 59 | Admitting: Dermatology

## 2023-01-11 ENCOUNTER — Other Ambulatory Visit (HOSPITAL_COMMUNITY): Payer: Self-pay | Admitting: Internal Medicine

## 2023-01-11 DIAGNOSIS — E7800 Pure hypercholesterolemia, unspecified: Secondary | ICD-10-CM

## 2023-02-01 ENCOUNTER — Ambulatory Visit (HOSPITAL_BASED_OUTPATIENT_CLINIC_OR_DEPARTMENT_OTHER)
Admission: RE | Admit: 2023-02-01 | Discharge: 2023-02-01 | Disposition: A | Discharge status: Home / Self Care | Payer: 59 | Source: Ambulatory Visit | Attending: Internal Medicine | Admitting: Internal Medicine

## 2023-02-01 DIAGNOSIS — E7800 Pure hypercholesterolemia, unspecified: Secondary | ICD-10-CM

## 2023-02-02 ENCOUNTER — Ambulatory Visit: Payer: 59 | Admitting: Dermatology

## 2023-02-02 DIAGNOSIS — R931 Abnormal findings on diagnostic imaging of heart and coronary circulation: Secondary | ICD-10-CM | POA: Insufficient documentation

## 2023-02-02 DIAGNOSIS — L821 Other seborrheic keratosis: Secondary | ICD-10-CM | POA: Diagnosis not present

## 2023-02-02 DIAGNOSIS — W908XXA Exposure to other nonionizing radiation, initial encounter: Secondary | ICD-10-CM

## 2023-02-02 DIAGNOSIS — L814 Other melanin hyperpigmentation: Secondary | ICD-10-CM | POA: Diagnosis not present

## 2023-02-02 DIAGNOSIS — L739 Follicular disorder, unspecified: Secondary | ICD-10-CM | POA: Diagnosis not present

## 2023-02-02 DIAGNOSIS — B079 Viral wart, unspecified: Secondary | ICD-10-CM

## 2023-02-02 DIAGNOSIS — Z1283 Encounter for screening for malignant neoplasm of skin: Secondary | ICD-10-CM

## 2023-02-02 DIAGNOSIS — L578 Other skin changes due to chronic exposure to nonionizing radiation: Secondary | ICD-10-CM

## 2023-02-02 DIAGNOSIS — D229 Melanocytic nevi, unspecified: Secondary | ICD-10-CM

## 2023-02-02 DIAGNOSIS — I2510 Atherosclerotic heart disease of native coronary artery without angina pectoris: Secondary | ICD-10-CM | POA: Insufficient documentation

## 2023-02-02 DIAGNOSIS — D1801 Hemangioma of skin and subcutaneous tissue: Secondary | ICD-10-CM | POA: Diagnosis not present

## 2023-02-02 MED ORDER — CLOBETASOL PROPIONATE 0.05 % EX SOLN: 1.0000 | Freq: Two times a day (BID) | CUTANEOUS | 0 refills | Status: AC

## 2023-02-02 NOTE — Patient Instructions (Addendum)
Hello Vincent Hahn,  Thank you for visiting our clinic today. Your dedication to enhancing your skin health is greatly appreciated. Below is a summary of the essential instructions from today's consultation:  - Benzoyl Peroxide: Begin using daily to manage your skin conditions.  - Doxycycline: Take at the first sign of tenderness early on.  - DHS Zinc Shampoo: Use daily for treating folliculitis on the scalp. Allow it to sit for 3 minutes before rinsing.  - Clobetasol Solution: Apply topically as directed to reduce inflammation.  - Wart Treatment: The warts treated today by freezing should crust and fall off within 2-3 weeks. If they do not, please return for a follow-up treatment.  - Skin Tags: Removal has been performed in-office today for cosmetic reasons and to prevent infection.  - Aquaphor: Apply generously on the treated areas to aid in healing.  Please keep a close watch on your skin for any new or changing moles, and do not hesitate to report any unusual symptoms. Should you find it necessary, please schedule a follow-up appointment with [place].  Warm regards,  Dr. [facility] Reusing [facility]      Cryotherapy Aftercare  Wash gently with soap and water everyday.   Apply Vaseline and Band-Aid daily until healed.        Important Information  Due to recent changes in healthcare laws, you may see results of your pathology and/or laboratory studies on MyChart before the doctors have had a chance to review them. We understand that in some cases there may be results that are confusing or concerning to you. Please understand that not all results are received at the same time and often the doctors may need to interpret multiple results in order to provide you with the best plan of care or course of treatment. Therefore, we ask that you please give [place] 2 business days to thoroughly review all your results before contacting the office for clarification. Should we see a critical lab result,  you will be contacted sooner.   If You Need Anything After Your Visit  If you have any questions or concerns for your doctor, please call our main line at [phone] If no one answers, please leave a voicemail as directed and we will return your call as soon as possible. Messages left after 4 pm will be answered the following business day.   You may also send [place] a message via MyChart. We typically respond to MyChart messages within 1-2 business days.  For prescription refills, please ask your pharmacy to contact our office. Our fax number is [phone].  If you have an urgent issue when the clinic is closed that cannot wait until the next business day, you can page your doctor at the number below.    Please note that while we do our best to be available for urgent issues outside of office hours, we are not available 24/7.   If you have an urgent issue and are unable to reach [place], you may choose to seek medical care at your doctor's office, retail clinic, urgent care center, or emergency room.  If you have a medical emergency, please immediately call 911 or go to the emergency department. In the event of inclement weather, please call our main line at [phone] for an update on the status of any delays or closures.  Dermatology Medication Tips: Please keep the boxes that topical medications come in in order to help keep track of the instructions about where and how to use these. Pharmacies typically  print the medication instructions only on the boxes and not directly on the medication tubes.   If your medication is too expensive, please contact our office at [phone] or send [place] a message through MyChart.   We are unable to tell what your co-pay for medications will be in advance as this is different depending on your insurance coverage. However, we may be able to find a substitute medication at lower cost or fill out paperwork to get insurance to cover a needed medication.   If a  prior authorization is required to get your medication covered by your insurance company, please allow [place] 1-2 business days to complete this process.  Drug prices often vary depending on where the prescription is filled and some pharmacies may offer cheaper prices.  The website www.goodrx.com contains coupons for medications through different pharmacies. The prices here do not account for what the cost may be with help from insurance (it may be cheaper with your insurance), but the website can give you the price if you did not use any insurance.  - You can print the associated coupon and take it with your prescription to the pharmacy.  - You may also stop by our office during regular business hours and pick up a GoodRx coupon card.  - If you need your prescription sent electronically to a different pharmacy, notify our office through [facility] or by phone at [phone]

## 2023-02-02 NOTE — Progress Notes (Signed)
   Follow-Up Visit   Subjective  Vincent Hahn is a 47 y.o. male who presents for the following: TBSE  Patient present today for follow up visit for follow up . Patient was last evaluated on 07/20/22 for HS & Cyst. Patient reports sxs are better. He stated that he has been using hydrogen peroxide on the flared areas and it clears up quickly. He stated that he does have some spots that he would like to be evaluated during his TBSE today. Patient denies medication changes.  The following portions of the chart were reviewed this encounter and updated as appropriate: medications, allergies, medical history  Review of Systems:  No other skin or systemic complaints except as noted in HPI or Assessment and Plan.  Objective  Well appearing patient in no apparent distress; mood and affect are within normal limits.  A focused examination was performed of the following areas: inner thigh  Relevant exam findings are noted in the Assessment and Plan.    Assessment & Plan   LENTIGINES, SEBORRHEIC KERATOSES, HEMANGIOMAS - Benign normal skin lesions - Benign-appearing - Call for any changes  BENIGN MELANOCYTIC NEVI - Tan-brown and/or pink-flesh-colored symmetric macules and papules - Benign appearing on exam today - Observation - Call clinic for new or changing moles - Recommend daily use of broad spectrum spf 30+ sunscreen to sun-exposed areas.   ACTINIC DAMAGE - Chronic condition, secondary to cumulative UV/sun exposure - diffuse scaly erythematous macules with underlying dyspigmentation - Recommend daily broad spectrum sunscreen SPF 30+ to sun-exposed areas, reapply every 2 hours as needed.  - Staying in the shade or wearing long sleeves, sun glasses (UVA+UVB protection) and wide brim hats (4-inch brim around the entire circumference of the hat) are also recommended for sun protection.  - Call for new or changing lesions.  SKIN CANCER SCREENING PERFORMED TODAY  FOLLICULITIS Exam:  Perifollicular erythematous papules and pustules  Folliculitis occurs due to inflammation of the superficial hair follicle (pore), resulting in acne-like lesions (pus bumps). It can be infectious (bacterial, fungal) or noninfectious (shaving, tight clothing, heat/sweat, medications).  Folliculitis can be acute or chronic and recommended treatment depends on the underlying cause of folliculitis.  Treatment Plan: - Recommended DHS Zinc to be purchases OTC - Prescribed Clobetasol 0.05% Solution   WART Exam: verrucous papule(s)  Counseling Discussed viral / HPV (Human Papilloma Virus) etiology and risk of spread /infectivity to other areas of body as well as to other people.  Multiple treatments and methods may be required to clear warts and it is possible treatment may not be successful.  Treatment risks include discoloration; scarring and there is still potential for wart recurrence.  Treatment Plan: - Treated for multiple warts in office with cryotherapy    No follow-ups on file.    Documentation: I have reviewed the above documentation for accuracy and completeness, and I agree with the above.   I, Arval Brandstetter Marcha Solders, CMA, am acting as scribe for [employer], DO.   Langston Reusing, DO

## 2023-02-07 ENCOUNTER — Encounter: Payer: Self-pay | Admitting: Dermatology

## 2024-04-04 ENCOUNTER — Telehealth: Admitting: Physician Assistant

## 2024-04-04 DIAGNOSIS — B9689 Other specified bacterial agents as the cause of diseases classified elsewhere: Secondary | ICD-10-CM | POA: Diagnosis not present

## 2024-04-04 DIAGNOSIS — J208 Acute bronchitis due to other specified organisms: Secondary | ICD-10-CM

## 2024-04-04 MED ORDER — PREDNISONE 20 MG PO TABS: 40.0000 mg | ORAL_TABLET | Freq: Every day | ORAL | 0 refills | Status: AC

## 2024-04-04 MED ORDER — PSEUDOEPH-BROMPHEN-DM 30-2-10 MG/5ML PO SYRP: 5.0000 mL | ORAL_SOLUTION | Freq: Four times a day (QID) | ORAL | 0 refills | Status: AC | PRN

## 2024-04-04 NOTE — Progress Notes (Signed)
 " Virtual Visit Consent   Vincent Hahn, you are scheduled for a virtual visit with a [facility] provider today. Just as with appointments in the office, your consent must be obtained to participate. Your consent will be active for this visit and any virtual visit you may have with one of our providers in the next 365 days. If you have a MyChart account, a copy of this consent can be sent to you electronically.  As this is a virtual visit, video technology does not allow for your provider to perform a traditional examination. This may limit your provider's ability to fully assess your condition. If your provider identifies any concerns that need to be evaluated in person or the need to arrange testing (such as labs, EKG, etc.), we will make arrangements to do so. Although advances in technology are sophisticated, we cannot ensure that it will always work on either your end or our end. If the connection with a video visit is poor, the visit may have to be switched to a telephone visit. With either a video or telephone visit, we are not always able to ensure that we have a secure connection.  By engaging in this virtual visit, you consent to the provision of healthcare and authorize for your insurance to be billed (if applicable) for the services provided during this visit. Depending on your insurance coverage, you may receive a charge related to this service.  I need to obtain your verbal consent now. Are you willing to proceed with your visit today? Vincent Hahn has provided verbal consent on 04/04/2024 for a virtual visit (video or telephone). Vincent CHRISTELLA Dickinson, [name]  Date: 04/04/2024 1:40 PM   Virtual Visit via Video Note   I, Vincent Hahn, connected with  Vincent Hahn  ([id number], 05/09/1975) on 04/04/2024 at  1:30 PM EST by a video-enabled telemedicine application and verified that I am speaking with the correct person using two identifiers.  Location: Patient: Virtual Visit Location  Patient: Home Provider: Virtual Visit Location Provider: Home Office   I discussed the limitations of evaluation and management by telemedicine and the availability of in person appointments. The patient expressed understanding and agreed to proceed.    History of Present Illness: Vincent Hahn is a 49 y.o. who identifies as a male who was assigned male at birth, and is being seen today for cough and congestion.  HPI: URI  This is a new problem. The current episode started 1 to 4 weeks ago (before Christmas). The problem has been gradually worsening. There has been no fever. Associated symptoms include chest pain (from coughing), congestion, coughing (dry), headaches, nausea (just today), a plugged ear sensation, rhinorrhea (and post nasal drainage), sinus pain, a sore throat (mild) and wheezing. Pertinent negatives include no diarrhea, ear pain or vomiting. Associated symptoms comments: Mild chills. Treatments tried: mucinex, tylenol , albuterol , 1 tablet BID of Doxycycline  x 2 days. The treatment provided no relief.     Problems:  Patient Active Problem List   Diagnosis Date Noted   Coronary artery disease involving native coronary artery of native heart without angina pectoris 02/02/2023   Elevated coronary artery calcium score 02/02/2023   GERD (gastroesophageal reflux disease) 12/19/2019   Morbid obesity (HCC) 09/07/2015   Essential hypertension 09/07/2015   Asthma with acute exacerbation 02/13/2013    Allergies: Allergies[1] Medications: Current Medications[2]  Observations/Objective: Patient is well-developed, well-nourished in no acute distress.  Resting comfortably at home.  Head is normocephalic, atraumatic.  No labored breathing.  Speech is clear and coherent with logical content.  Patient is alert and oriented at baseline.    Assessment and Plan: 1. Acute bacterial bronchitis (Primary) - brompheniramine-pseudoephedrine-DM 30-2-10 MG/5ML syrup; Take 5 mLs by mouth 4  (four) times daily as needed.  Dispense: 120 mL; Refill: 0 - predniSONE  (DELTASONE ) 20 MG tablet; Take 2 tablets (40 mg total) by mouth daily with breakfast.  Dispense: 10 tablet; Refill: 0  - Worsening over a week despite OTC medications - Will treat with Prednisone  and Bromfed DM - Can continue Doxycycline  he has at home, advised twice daily for 7 days for a full treatment - Continue Albuterol  inhaler as prescribed - Push fluids.  - Rest.  - Steam and humidifier can help - Seek in person evaluation if worsening or symptoms fail to improve    Follow Up Instructions: I discussed the assessment and treatment plan with the patient. The patient was provided an opportunity to ask questions and all were answered. The patient agreed with the plan and demonstrated an understanding of the instructions.  A copy of instructions were sent to the patient via MyChart unless otherwise noted below.    The patient was advised to call back or seek an in-person evaluation if the symptoms worsen or if the condition fails to improve as anticipated.    Vincent HERO Sherria Riemann, [name]     [1] No Known Allergies [2]  Current Outpatient Medications:    brompheniramine-pseudoephedrine-DM 30-2-10 MG/5ML syrup, Take 5 mLs by mouth 4 (four) times daily as needed., Disp: 120 mL, Rfl: 0   predniSONE  (DELTASONE ) 20 MG tablet, Take 2 tablets (40 mg total) by mouth daily with breakfast., Disp: 10 tablet, Rfl: 0   acetaminophen  (TYLENOL ) 325 MG tablet, 1 tablet as needed Orally every 4 hrs, Disp: , Rfl:    Albuterol  Sulfate (PROAIR  RESPICLICK) 108 (90 Base) MCG/ACT AEPB, 2 inhalations Inhalation every 8 hours as  needed for 30 day(s), Disp: , Rfl:    clobetasol  (TEMOVATE ) 0.05 % external solution, Apply 1 Application topically 2 (two) times daily., Disp: 50 mL, Rfl: 0   enalapril  (VASOTEC ) 20 MG tablet, TAKE 1 TABLET(20 MG) BY MOUTH TWICE DAILY, Disp: 180 tablet, Rfl: 1   hydrochlorothiazide  (HYDRODIURIL ) 25 MG tablet,  TAKE 1/2 TABLET BY MOUTH DAILY, Disp: 90 tablet, Rfl: 0   lansoprazole (PREVACID SOLUTAB) 15 MG disintegrating tablet, Lansoprazole, Disp: , Rfl:    lansoprazole (PREVACID) 30 MG capsule, Take 1 capsule by mouth as needed., Disp: , Rfl:    metFORMIN (GLUCOPHAGE) 850 MG tablet, Take 850 mg by mouth daily., Disp: , Rfl:    Multiple Vitamin (MULTI VITAMIN DAILY PO), Take 1 tablet by mouth daily., Disp: , Rfl:    Multiple Vitamins-Minerals (ONE A DAY MENS VITACRAVES) CHEW, as directed Orally, Disp: , Rfl:    pantoprazole  (PROTONIX ) 40 MG tablet, Take 1 tablet (40 mg total) by mouth 2 (two) times daily., Disp: 180 tablet, Rfl: 1   TRULICITY 4.5 MG/0.5ML SOPN, SMARTSIG:4.5 Milligram(s) SUB-Q Once a Week, Disp: , Rfl:   "

## 2024-04-04 NOTE — Patient Instructions (Signed)
 " Vincent Hahn, thank you for joining [name], [name] for today's virtual visit.  While this provider is not your primary care provider (PCP), if your PCP is located in our provider database this encounter information will be shared with them immediately following your visit.   A [facility] MyChart account gives you access to today's visit and all your visits, tests, and labs performed at [facility]  click here if you don't have a [facility] MyChart account or go to [email or web address]  Consent: (Patient) Vincent Hahn provided verbal consent for this virtual visit at the beginning of the encounter.  Current Medications:  Current Outpatient Medications:    brompheniramine-pseudoephedrine-DM 30-2-10 MG/5ML syrup, Take 5 mLs by mouth 4 (four) times daily as needed., Disp: 120 mL, Rfl: 0   predniSONE  (DELTASONE ) 20 MG tablet, Take 2 tablets (40 mg total) by mouth daily with breakfast., Disp: 10 tablet, Rfl: 0   acetaminophen  (TYLENOL ) 325 MG tablet, 1 tablet as needed Orally every 4 hrs, Disp: , Rfl:    Albuterol  Sulfate (PROAIR  RESPICLICK) 108 (90 Base) MCG/ACT AEPB, 2 inhalations Inhalation every 8 hours as  needed for 30 day(s), Disp: , Rfl:    clobetasol  (TEMOVATE ) 0.05 % external solution, Apply 1 Application topically 2 (two) times daily., Disp: 50 mL, Rfl: 0   enalapril  (VASOTEC ) 20 MG tablet, TAKE 1 TABLET(20 MG) BY MOUTH TWICE DAILY, Disp: 180 tablet, Rfl: 1   hydrochlorothiazide  (HYDRODIURIL ) 25 MG tablet, TAKE 1/2 TABLET BY MOUTH DAILY, Disp: 90 tablet, Rfl: 0   lansoprazole (PREVACID SOLUTAB) 15 MG disintegrating tablet, Lansoprazole, Disp: , Rfl:    lansoprazole (PREVACID) 30 MG capsule, Take 1 capsule by mouth as needed., Disp: , Rfl:    metFORMIN (GLUCOPHAGE) 850 MG tablet, Take 850 mg by mouth daily., Disp: , Rfl:    Multiple Vitamin (MULTI VITAMIN DAILY PO), Take 1 tablet by mouth daily., Disp: , Rfl:    Multiple Vitamins-Minerals (ONE A DAY  MENS VITACRAVES) CHEW, as directed Orally, Disp: , Rfl:    pantoprazole  (PROTONIX ) 40 MG tablet, Take 1 tablet (40 mg total) by mouth 2 (two) times daily., Disp: 180 tablet, Rfl: 1   TRULICITY 4.5 MG/0.5ML SOPN, SMARTSIG:4.5 Milligram(s) SUB-Q Once a Week, Disp: , Rfl:    Medications ordered in this encounter:  Meds ordered this encounter  Medications   brompheniramine-pseudoephedrine-DM 30-2-10 MG/5ML syrup    Sig: Take 5 mLs by mouth 4 (four) times daily as needed.    Dispense:  120 mL    Refill:  0    Supervising Provider:   BLAISE ALEENE KIDD [[id number]]   predniSONE  (DELTASONE ) 20 MG tablet    Sig: Take 2 tablets (40 mg total) by mouth daily with breakfast.    Dispense:  10 tablet    Refill:  0    Supervising Provider:   BLAISE ALEENE KIDD [[id number]]     *If you need refills on other medications prior to your next appointment, please contact your pharmacy*  Follow-Up: Call back or seek an in-person evaluation if the symptoms worsen or if the condition fails to improve as anticipated.  [facility] [phone]  Other Instructions  Acute Bronchitis, Adult  Acute bronchitis is sudden inflammation of the main airways (bronchi) that come off the windpipe (trachea) in the lungs. The swelling causes the airways to get smaller and make more mucus than normal. This can make it hard to breathe and can cause coughing or noisy breathing (wheezing).  Acute bronchitis may last several weeks. The cough may last longer. Allergies, asthma, and exposure to smoke may make the condition worse. What are the causes? This condition can be caused by germs and by substances that irritate the lungs, including: Cold and flu viruses. The most common cause of this condition is the virus that causes the common cold. Bacteria. This is less common. Breathing in substances that irritate the lungs, including: Smoke from cigarettes and other forms of tobacco. Dust and pollen. Fumes from household  cleaning products, gases, or burned fuel. Indoor or outdoor air pollution. What increases the risk? The following factors may make you more likely to develop this condition: A weak body's defense system, also called the immune system. A condition that affects your lungs and breathing, such as asthma. What are the signs or symptoms? Common symptoms of this condition include: Coughing. This may bring up clear, yellow, or green mucus from your lungs (sputum). Wheezing. Runny or stuffy nose. Having too much mucus in your lungs (chest congestion). Shortness of breath. Aches and pains, including sore throat or chest. How is this diagnosed? This condition is usually diagnosed based on: Your symptoms and medical history. A physical exam. You may also have other tests, including tests to rule out other conditions, such as pneumonia. These tests include: A test of lung function. Test of a mucus sample to look for the presence of bacteria. Tests to check the oxygen level in your blood. Blood tests. Chest X-ray. How is this treated? Most cases of acute bronchitis clear up over time without treatment. Your health care provider may recommend: Drinking more fluids to help thin your mucus so it is easier to cough up. Taking inhaled medicine (inhaler) to improve air flow in and out of your lungs. Using a vaporizer or a humidifier. These are machines that add water to the air to help you breathe better. Taking a medicine that thins mucus and clears congestion (expectorant). Taking a medicine that prevents or stops coughing (cough suppressant). It is not common to take an antibiotic medicine for this condition. Follow these instructions at home:  Take over-the-counter and prescription medicines only as told by your health care provider. Use an inhaler, vaporizer, or humidifier as told by your health care provider. Take two teaspoons (10 mL) of honey at bedtime to lessen coughing at night. Drink  enough fluid to keep your urine pale yellow. Do not use any products that contain nicotine or tobacco. These products include cigarettes, chewing tobacco, and vaping devices, such as e-cigarettes. If you need help quitting, ask your health care provider. Get plenty of rest. Return to your normal activities as told by your health care provider. Ask your health care provider what activities are safe for you. Keep all follow-up visits. This is important. How is this prevented? To lower your risk of getting this condition again: Wash your hands often with soap and water for at least 20 seconds. If soap and water are not available, use hand sanitizer. Avoid contact with people who have cold symptoms. Try not to touch your mouth, nose, or eyes with your hands. Avoid breathing in smoke or chemical fumes. Breathing smoke or chemical fumes will make your condition worse. Get the flu shot every year. Contact a health care provider if: Your symptoms do not improve after 2 weeks. You have trouble coughing up the mucus. Your cough keeps you awake at night. You have a fever. Get help right away if you: Cough up blood. Feel  pain in your chest. Have severe shortness of breath. Faint or keep feeling like you are going to faint. Have a severe headache. Have a fever or chills that get worse. These symptoms may represent a serious problem that is an emergency. Do not wait to see if the symptoms will go away. Get medical help right away. Call your local emergency services (911 in the [place] Do not drive yourself to the hospital. Summary Acute bronchitis is inflammation of the main airways (bronchi) that come off the windpipe (trachea) in the lungs. The swelling causes the airways to get smaller and make more mucus than normal. Drinking more fluids can help thin your mucus so it is easier to cough up. Take over-the-counter and prescription medicines only as told by your health care provider. Do not use any  products that contain nicotine or tobacco. These products include cigarettes, chewing tobacco, and vaping devices, such as e-cigarettes. If you need help quitting, ask your health care provider. Contact a health care provider if your symptoms do not improve after 2 weeks. This information is not intended to replace advice given to you by your health care provider. Make sure you discuss any questions you have with your health care provider. Document Revised: 07/01/2021 Document Reviewed: 07/22/2020 [employer] Patient Education  2024 [employer]   If you have been instructed to have an in-person evaluation today at a local Urgent Care facility, please use the link below. It will take you to a list of all of our available [facility], including address, phone number and hours of operation. Please do not delay care.  [facility]  If you or a family member do not have a primary care provider, use the link below to schedule a visit and establish care. When you choose a [facility] primary care physician or advanced practice provider, you gain a long-term partner in health. Find a Primary Care Provider  Learn more about [facility] in-office and virtual care options: [facility] - Get Care Now "
